# Patient Record
Sex: Female | Born: 1990 | Race: White | Hispanic: No | Marital: Married | Smoking: Never smoker
Health system: Southern US, Community
[De-identification: ages and names within clinical notes are randomized; demographics above are authoritative.]

## PROBLEM LIST (undated history)

## (undated) DIAGNOSIS — J309 Allergic rhinitis, unspecified: Secondary | ICD-10-CM

## (undated) DIAGNOSIS — IMO0002 Reserved for concepts with insufficient information to code with codable children: Secondary | ICD-10-CM

## (undated) DIAGNOSIS — N83209 Unspecified ovarian cyst, unspecified side: Secondary | ICD-10-CM

## (undated) DIAGNOSIS — R87629 Unspecified abnormal cytological findings in specimens from vagina: Secondary | ICD-10-CM

## (undated) DIAGNOSIS — G43019 Migraine without aura, intractable, without status migrainosus: Secondary | ICD-10-CM

## (undated) DIAGNOSIS — G43909 Migraine, unspecified, not intractable, without status migrainosus: Secondary | ICD-10-CM

## (undated) HISTORY — PX: FOOT SURGERY: SHX648

## (undated) HISTORY — DX: Allergic rhinitis, unspecified: J30.9

## (undated) HISTORY — DX: Migraine, unspecified, not intractable, without status migrainosus: G43.909

## (undated) HISTORY — DX: Unspecified ovarian cyst, unspecified side: N83.209

## (undated) HISTORY — DX: Unspecified abnormal cytological findings in specimens from vagina: R87.629

## (undated) HISTORY — PX: TONSILLECTOMY: SUR1361

## (undated) HISTORY — PX: TONSILLECTOMY: SHX5217

## (undated) HISTORY — DX: Reserved for concepts with insufficient information to code with codable children: IMO0002

## (undated) HISTORY — DX: Migraine without aura, intractable, without status migrainosus: G43.019

---
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
## 2010-08-10 LAB — HIV ANTIBODY (ROUTINE TESTING W REFLEX): HIV: NONREACTIVE

## 2010-09-20 ENCOUNTER — Ambulatory Visit: Payer: Self-pay | Admitting: Family Medicine

## 2010-11-22 ENCOUNTER — Ambulatory Visit: Payer: Self-pay | Admitting: Family Medicine

## 2011-01-02 ENCOUNTER — Ambulatory Visit
Admission: RE | Admit: 2011-01-02 | Discharge: 2011-01-02 | Payer: Self-pay | Source: Home / Self Care | Attending: Family Medicine | Admitting: Family Medicine

## 2011-04-23 ENCOUNTER — Ambulatory Visit (INDEPENDENT_AMBULATORY_CARE_PROVIDER_SITE_OTHER): Payer: 59

## 2011-06-25 ENCOUNTER — Encounter: Payer: Self-pay | Admitting: Medical

## 2011-06-25 ENCOUNTER — Ambulatory Visit (INDEPENDENT_AMBULATORY_CARE_PROVIDER_SITE_OTHER): Payer: 59 | Admitting: Medical

## 2011-06-25 VITALS — BP 114/80 | HR 77 | Temp 98.4°F | Ht 68.0 in | Wt 138.0 lb

## 2011-06-25 DIAGNOSIS — G43909 Migraine, unspecified, not intractable, without status migrainosus: Secondary | ICD-10-CM

## 2011-06-25 DIAGNOSIS — L03114 Cellulitis of left upper limb: Secondary | ICD-10-CM

## 2011-06-25 DIAGNOSIS — IMO0002 Reserved for concepts with insufficient information to code with codable children: Secondary | ICD-10-CM

## 2011-06-25 MED ORDER — DOXYCYCLINE HYCLATE 100 MG PO TABS: 100.0000 mg | ORAL_TABLET | Freq: Two times a day (BID) | ORAL | Status: AC

## 2011-06-25 MED ORDER — FROVATRIPTAN SUCCINATE 2.5 MG PO TABS: 2.5000 mg | ORAL_TABLET | ORAL | Status: DC | PRN

## 2011-06-25 MED ORDER — AMITRIPTYLINE HCL 10 MG PO TABS: 10.0000 mg | ORAL_TABLET | Freq: Every day | ORAL | Status: DC

## 2011-06-25 NOTE — Patient Instructions (Signed)
Cellulitis Cellulitis is an infection of the skin and the tissue beneath it. The area is typically red and tender. It is caused by germs (bacteria) (usually staph or strep) that enter the body through cuts or sores. Cellulitis most commonly occurs in the arms or lower legs.  HOME CARE INSTRUCTIONS  If you are given a prescription for medications which kill germs (antibiotics), take as directed until finished.   If the infection is on the arm or leg, keep the limb elevated as able.   Use a warm cloth several times per day to relieve pain and encourage healing.   See your caregiver for recheck of the infected site as needed if worsening, or sooner if problems arise.   Only take over-the-counter or prescription medicines for pain, discomfort, or fever as directed by your caregiver.  SEEK MEDICAL CARE IF:  An oral temperature above 101 develops, not controlled by medication.   The area of redness (inflammation) is spreading, there are red streaks coming from the infected site, or if a part of the infection begins to turn dark in color.   The joint or bone underneath the infected skin becomes painful after the skin has healed.   The infection returns in the same or another area after it seems to have gone away.   A boil or bump swells up. This may be an abscess.   New, unexplained problems such as pain or fever develop.  SEEK IMMEDIATE MEDICAL CARE IF:  You or your child feels drowsy or lethargic.   There is vomiting, diarrhea, or lasting discomfort or feeling ill (malaise) with muscle aches and pains.  MAKE SURE YOU:   Understand these instructions.   Will watch your condition.   Will get help right away if you are not doing well or get worse.  Document Released: 09/26/2005 Document Re-Released: 10/14/2009 [facility] Patient Information 2011 [place], [place].

## 2011-06-25 NOTE — Progress Notes (Signed)
  Subjective:   HPI  Marilyn Hunter is a 21 y.o. female who presents for c/o bug bite on left elbow area x 3 days.  She notes swelling, pain, warmth.  No prior similar reaction to bug bites.  She also has several mosquito bites on her legs, but no reaction like this.    She also reports h/xo migraines.   Has seen several neurologists in the past.  She has been prescribed preventative medications, but always declined to take them.  She notes 3 headaches at least per week.  Uses Excedrin migraine somewhat regularly, Frova in worse headaches.  She notes that certain foods and stress triggers the headaches. They are also worse around her periods.  She has been working on LSAT and applying to law school which has been stressful.  No other aggravating or relieving factors.  No other c/o.  The following portions of the patient's history were reviewed and updated as appropriate: allergies, current medications, past family history, past medical history, past social history, past surgical history and problem list.   Review of Systems Constitutional: denies fever, chills, sweats, unexpected weight change, anorexia, fatigue Allergy: congestion, sneezing ENT: no runny nose, ear pain, sore throat, hoarseness, sinus pain Cardiology: denies chest pain, palpitations, edema Respiratory: denies cough, shortness of breath, wheezing Gastroenterology: +nausea; denies abdominal pain, vomiting, diarrhea, constipation Musculoskeletal: denies arthralgias, myalgias, joint swelling, back pain Ophthalmology: denies vision changes Urology: denies dysuria, difficulty urinating, hematuria, urinary frequency, urgency Neurology: no headache, weakness, tingling, numbness Psychology: denies depressed mood, agitation, sleep problems     Objective:   Physical Exam  General appearance: alert, no distress, WD/WN, white female Skin: left forearm just distal to elbow with 5cm area of warmth, swelling, and faint erythema, tender.   No induration.  Otherwise skin unremarkable.  Neck: supple, no lymphadenopathy, no thyromegaly, no masses Heart: RRR, normal S1, S2, no murmurs Lungs: CTA bilaterally, no wheezes, rhonchi, or rales Extremities: no edema, no cyanosis, no clubbing Pulses: 2+ symmetric, upper and lower extremities, normal cap refill Neurological: alert, oriented x 3, CN2-12 intact, gait normal   Assessment :    Encounter Diagnoses  Name Primary?  . Cellulitis of arm, left Yes  . Migraine      Plan:    Cellulitis - Rx for Doxycycline, warm compresses, discussed signs of worsening infection.  Call/return if not improving in 2-3 days.  Migraines - she has long hx/o migraines, and has seen several neurologists prior. She had gotten scripts for preventative medications prior, but just never would take them for fear of side effects including weight gain.  She is getting 3 headaches weekly.  Uses Frova or Excedrin for acute headache.   She is agreeable to trial of Amitryptyline today.  Discussed risks/benefits, recheck in 26mo.

## 2011-08-21 ENCOUNTER — Ambulatory Visit (INDEPENDENT_AMBULATORY_CARE_PROVIDER_SITE_OTHER): Payer: 59 | Admitting: Family Medicine

## 2011-08-21 ENCOUNTER — Encounter: Payer: Self-pay | Admitting: Family Medicine

## 2011-08-21 VITALS — BP 120/72 | HR 88 | Wt 140.0 lb

## 2011-08-21 DIAGNOSIS — N390 Urinary tract infection, site not specified: Secondary | ICD-10-CM

## 2011-08-21 MED ORDER — SULFAMETHOXAZOLE-TRIMETHOPRIM 800-160 MG PO TABS: 1.0000 | ORAL_TABLET | Freq: Two times a day (BID) | ORAL | Status: AC

## 2011-08-21 NOTE — Progress Notes (Signed)
  Subjective:    Patient ID: Marilyn Hunter, female    DOB: 1990/08/13, 21 y.o.   MRN: [id number]  HPI Woke up this morning complaining of dysuria, frequency and low abdominal pressure   Review of Systems     Objective:   Physical Exam Alert and in no distress. Abdominal exam shows no masses or tenderness. Urine microscopic positive bacteria and white       Assessment & Plan:  UTI I will give her Septra. Also recommend Azo-Standard and call if not entirely better

## 2011-08-21 NOTE — Patient Instructions (Signed)
Drink plenty of fluids and use the Azo-Standard. If you're not act normal after 3 days, call me

## 2011-09-17 ENCOUNTER — Ambulatory Visit (INDEPENDENT_AMBULATORY_CARE_PROVIDER_SITE_OTHER): Payer: 59 | Admitting: Medical

## 2011-09-17 ENCOUNTER — Encounter: Payer: Self-pay | Admitting: Medical

## 2011-09-17 VITALS — BP 100/72 | HR 80 | Temp 98.1°F | Resp 18 | Ht 67.0 in | Wt 138.0 lb

## 2011-09-17 DIAGNOSIS — J329 Chronic sinusitis, unspecified: Secondary | ICD-10-CM

## 2011-09-17 DIAGNOSIS — G43909 Migraine, unspecified, not intractable, without status migrainosus: Secondary | ICD-10-CM

## 2011-09-17 MED ORDER — AMOXICILLIN 875 MG PO TABS: 875.0000 mg | ORAL_TABLET | Freq: Two times a day (BID) | ORAL | Status: AC

## 2011-09-17 NOTE — Progress Notes (Signed)
Subjective:     Marilyn Hunter is a 21 y.o. female who presents for evaluation of possible sinus infection.  She notes 3-4 days of sinus pressure, chest congestion, thick green/brown mucous in sputum coughed up from nasal drainage, sore throat, and feels exhausted.  Using OTC Mucinex and Afrin.  She has not yet started Amitriptyline.  She has been working with gynecology to help get periods under control which will hopefully ultimately help her headaches.  In the last year she has failed Yaz, Lo Estrin, and another OCP.  She is currently on her second Paediatric nurse.  No other c/o.  I saw her in 4/12 for sinus infection.   The following portions of the patient's history were reviewed and updated as appropriate: allergies, current medications, past family history, past medical history, past social history, past surgical history and problem list.  Past Medical History  Diagnosis Date  . Migraine     Review of Systems Constitutional: denies fever, chills, sweats, anorexia Skin: denies rash HEENT: +ear pressure; denies itchy watery eyes Cardiovascular: denies chest pain Lungs: denies wheezing Abdomen: denies abdominal pain, nausea, vomiting, diarrhea GU: denies dysuria  Objective:   Filed Vitals:   09/17/11 1003  BP: 100/72  Pulse: 80  Temp: 98.1 F (36.7 C)  Resp: 18    General appearance: Alert, WD/WN, no distress, fatigued appearing white female                             Skin: warm, no rash                           Head: + frontal sinus tenderness,                            Eyes: conjunctiva normal, corneas clear, PERRLA                            Ears: pearly TMs, external ear canals normal                          Nose: septum midline, turbinates swollen, with erythema and clear discharge             Mouth/throat: MMM, tongue normal, mild pharyngeal erythema                           Neck: supple, no adenopathy, no thyromegaly, non tender                          Heart: RRR,  normal S1, S2, no murmurs                         Lungs: CTA bilaterally, no wheezes, rales, or rhonchi      Assessment:   Encounter Diagnoses  Name Primary?  . Sinusitis Yes  . Migraine      Plan:   Prescription given for Amoxicillin.  Can c/t OTC Mucinex for congestion.  Tylenol or Ibuprofen OTC for fever and malaise.  Discussed symptomatic relief, nasal saline, and call or return if worse or not improving in 2-3 days.   Cautioned on Afrin, use 3 days or less only.   Migraines - she  has recently started Nuvaring to help get periods and hormonal changes under control. She is not taking Amitriptyline currently until gynecology helps get periods under control.

## 2011-11-06 ENCOUNTER — Ambulatory Visit: Payer: 59 | Admitting: Medical

## 2012-01-09 ENCOUNTER — Encounter: Payer: Self-pay | Admitting: Internal Medicine

## 2012-01-10 ENCOUNTER — Ambulatory Visit (INDEPENDENT_AMBULATORY_CARE_PROVIDER_SITE_OTHER): Payer: 59 | Admitting: Family Medicine

## 2012-01-10 ENCOUNTER — Encounter: Payer: Self-pay | Admitting: Family Medicine

## 2012-01-10 VITALS — BP 120/80 | HR 83 | Temp 98.5°F | Ht 67.0 in | Wt 135.0 lb

## 2012-01-10 DIAGNOSIS — J209 Acute bronchitis, unspecified: Secondary | ICD-10-CM

## 2012-01-10 DIAGNOSIS — J019 Acute sinusitis, unspecified: Secondary | ICD-10-CM

## 2012-01-10 MED ORDER — AMOXICILLIN 875 MG PO TABS: 875.0000 mg | ORAL_TABLET | Freq: Two times a day (BID) | ORAL | Status: AC

## 2012-01-10 NOTE — Progress Notes (Signed)
  Subjective:    Patient ID: Marilyn Hunter, female    DOB: 1990-08-27, 22 y.o.   MRN: [id number]  HPI Approximately one week ago she developed a sore throat and 2 days later noted the relatively quick onset of chest congestion productive cough, malaise, fatigue. Approximately 2 days ago then she noted the onset of nasal congestion, sinus pressure, headache, PND. She has not smoke or have underlying allergies   Review of Systems     Objective:   Physical Exam alert and in no distress. Tympanic membranes and canals are normal. Throat is clear. Tonsils are normal. Neck is supple without adenopathy or thyromegaly. Cardiac exam shows a regular sinus rhythm without murmurs or gallops. Lungs are clear to auscultation. As the mucosa is slightly red with tenderness over maxillary sinuses       Assessment & Plan:   1. Sinusitis acute   2. Bronchitis, acute    I will treat her with Amoxil. She will call if not entirely better.

## 2012-01-10 NOTE — Patient Instructions (Signed)
Take all of the antibiotic and if not totally back to normal, call for more.

## 2012-07-24 ENCOUNTER — Ambulatory Visit (INDEPENDENT_AMBULATORY_CARE_PROVIDER_SITE_OTHER): Payer: 59 | Admitting: Family Medicine

## 2012-07-24 ENCOUNTER — Encounter: Payer: Self-pay | Admitting: Family Medicine

## 2012-07-24 VITALS — BP 118/72 | HR 72 | Temp 97.8°F | Ht 67.0 in | Wt 140.0 lb

## 2012-07-24 DIAGNOSIS — G43109 Migraine with aura, not intractable, without status migrainosus: Secondary | ICD-10-CM

## 2012-07-24 DIAGNOSIS — J069 Acute upper respiratory infection, unspecified: Secondary | ICD-10-CM

## 2012-07-24 MED ORDER — AZITHROMYCIN 250 MG PO TABS: ORAL_TABLET | ORAL | Status: AC

## 2012-07-24 MED ORDER — FROVATRIPTAN SUCCINATE 2.5 MG PO TABS: 2.5000 mg | ORAL_TABLET | ORAL | Status: DC | PRN

## 2012-07-24 NOTE — Progress Notes (Signed)
Chief Complaint  Patient presents with  . Sore Throat    started Monday with sore throat, then by Tuesday she had green/brown drainage and cough. She feels like her neck is swollen. Would like to know if you would refill her Frova today- and she would also like to switch to you for her care, she will schedule CPE when she checks out.   HPI: Patient presents with sinus complaints.  Started out as sore throat 3 days ago, then progressed to sinus congestion, discolored mucus and cough. Phlegm is chunky brown/green, and is complaining of swelling and pain in her neck.  Having pain in both cheeks.  Denies sick contacts, fever.  Has been taking Dayquil/Nyquil, and Afrin x 2 days.  Last sinus infection was in January, treated with amoxil. Also had sinus infection in September, also treated with amoxil. She has some h/o allergies since living here.  Has never tried sinus rinses.  Migraines:  Gets migraines monthly with her periods.  Has been trying to work with her GYN to have cycles less frequently, but that doesn't seem to be working.  Gets 5 bad migraines per month, plus additional headaches.  Sometimes the headache is too far gone that she doesn't even bother to take the Frova.  She does get aura prior to headaches.  She previously was rx'd amitriptyline, but admits that she never started it, she was too afraid.  Past Medical History  Diagnosis Date  . Migraines     chronic  . Allergic rhinitis, cause unspecified    Past Surgical History  Procedure Date  . Tonsillectomy age 25  . Foot surgery L 01/2012; R 02/2012    bilateral taylor bunionectomies, and similar to regular bunionectomy surgeries (1st and 5th metatarsals bilaterally)   History   Social History  . Marital Status: Single    Spouse Name: N/A    Number of Children: N/A  . Years of Education: N/A   Occupational History  . paralegal    Social History Main Topics  . Smoking status: Never Smoker   . Smokeless tobacco: Never Used   . Alcohol Use: Yes     5 drinks in a month  . Drug Use: No  . Sexually Active: Yes -- Female partner(s)    Birth Control/ Protection: Other-see comments     NuvaRing   Other Topics Concern  . 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 Lives with her boyfriend.   Family History  Problem Relation Age of Onset  . Hodgkin's lymphoma Mother 96  . Thyroid cancer Mother     (from radiation/hodgkin's treatment)  . Breast cancer Mother 36    possibly related to radiation treatment for hodgkins  . Cancer Mother     hodgkin's; thyroid; breast; now new tumor in thoracic spine  . Diabetes Neg Hx   . Heart disease Neg Hx    Current Outpatient Prescriptions on File Prior to Visit  Medication Sig Dispense Refill  . aspirin-acetaminophen-caffeine (EXCEDRIN MIGRAINE) 250-250-65 MG per tablet Take 1 tablet by mouth every 6 (six) hours as needed.        . etonogestrel-ethinyl estradiol (NUVARING) 0.12-0.015 MG/24HR vaginal ring Place 1 each vaginally every 28 (twenty-eight) days. Insert vaginally and leave in place for 3 consecutive weeks, then remove for 1 week.       . frovatriptan (FROVA) 2.5 MG tablet Take 1 tablet (2.5 mg total) by mouth as  needed. If recurs, may repeat after 2 hours. Max of 3 tabs in 24 hours.  10 tablet  2   No Known Allergies  ROS: Denies fevers, nausea, vomiting, diarrhea, skin rash, shortness of breath, chest pain. Feels achey in general, and fatigued. Denies numbness, tingling, weakness  PHYSICAL EXAM: BP 118/72  Pulse 72  Temp 97.8 F (36.6 C) (Oral)  Ht 5\' 7"  (1.702 m)  Wt 140 lb (63.504 kg)  BMI 21.93 kg/m2  LMP 07/20/2012 Well developed, pleasant female in no distress, slightly congested sounding HEENT:  PERRL ,EOMI conjunctiva clear. TM's and EAC's normal.  OP normal, slight cobblestoning posteriorly.  Nasal mucosa moderately edematous, no erythema or purulence noted.  Tender over maxillary sinuses Neck: no  thyromegaly or mass.  Some shotty anterior cervical lymphadenopathy bilaterally, nontender Heart: regular rate and rhythm Lungs: clear bilaterally Skin: no rash Psych: normal mood ,affect, hygiene and grooming Neuro: alert and oriented, cranial nerves intact.  Normal strength, sensation, gait.  ASSESSMENT/PLAN:  1. URI (upper respiratory infection)  azithromycin (ZITHROMAX) 250 MG tablet  2. Migraine headache with aura  frovatriptan (FROVA) 2.5 MG tablet   URI vs early sinus infection Continue decongestants, add Guaifenesin (Mucinex) and sinus rinses.  If symptoms persist/worsen over the next 3-5 days, start antibiotics (z-pak)   Migraines:  Discussed reasons and types of preventative medications in detail.  I think that her frequency/severity of her migraines are likely affecting her quality of life, and think she would benefit from a preventative therapy.  Reminded to take meds early, at time of aura.  Consider other birth control choices, if estrogen may be contributing--discuss with her GYN (ie Depo Provera, vs condoms). Refilled Frova.  Discussed potential decrease in efficacy with overuse.  She will think about these options, and f/u at CPE, sooner prn.

## 2012-07-24 NOTE — Patient Instructions (Signed)
URI (viral) vs early sinus infection (bacterial) Continue decongestants, add Guaifenesin (Mucinex) and sinus rinses.  If symptoms persist/worsen over the next 3-5 days, start antibiotics (z-pak)   Migraines:  Discussed reasons and types of preventative medications in detail.  I think that your frequency/severity of migraines are likely affecting quality of life, and think you would benefit from a preventative therapy.  Remember to take meds early, at time of aura.  Consider other birth control choices, if estrogen may be contributing--discuss with her GYN (ie Depo Provera, vs condoms).

## 2012-10-02 ENCOUNTER — Ambulatory Visit (INDEPENDENT_AMBULATORY_CARE_PROVIDER_SITE_OTHER): Payer: 59 | Admitting: Family Medicine

## 2012-10-02 ENCOUNTER — Encounter: Payer: Self-pay | Admitting: Family Medicine

## 2012-10-02 VITALS — BP 108/60 | HR 72 | Ht 67.0 in | Wt 141.0 lb

## 2012-10-02 DIAGNOSIS — Z0000 Encounter for general adult medical examination without abnormal findings: Secondary | ICD-10-CM

## 2012-10-02 DIAGNOSIS — G43109 Migraine with aura, not intractable, without status migrainosus: Secondary | ICD-10-CM

## 2012-10-02 DIAGNOSIS — Z23 Encounter for immunization: Secondary | ICD-10-CM

## 2012-10-02 LAB — POCT URINALYSIS DIPSTICK
Bilirubin, UA: NEGATIVE
Ketones, UA: NEGATIVE
Leukocytes, UA: NEGATIVE
Protein, UA: NEGATIVE
Spec Grav, UA: 1.01

## 2012-10-02 MED ORDER — NORTRIPTYLINE HCL 10 MG PO CAPS: ORAL_CAPSULE | ORAL | Status: DC

## 2012-10-02 NOTE — Patient Instructions (Addendum)
HEALTH MAINTENANCE RECOMMENDATIONS:  It is recommended that you get at least 30 minutes of aerobic exercise at least 5 days/week (for weight loss, you may need as much as 60-90 minutes). This can be any activity that gets your heart rate up. This can be divided in 10-15 minute intervals if needed, but try and build up your endurance at least once a week.  Weight bearing exercise is also recommended twice weekly.  Eat a healthy diet with lots of vegetables, fruits and fiber.  "Colorful" foods have a lot of vitamins (ie green vegetables, tomatoes, red peppers, etc).  Limit sweet tea, regular sodas and alcoholic beverages, all of which has a lot of calories and sugar.  Up to 1 alcoholic drink daily may be beneficial for women (unless trying to lose weight, watch sugars).  Drink a lot of water.  Calcium recommendations are 1200-1500 mg daily (1500 mg for postmenopausal women or women without ovaries), and vitamin D 1000 IU daily.  This should be obtained from diet and/or supplements (vitamins), and calcium should not be taken all at once, but in divided doses.  Monthly self breast exams.  Sunscreen of at least SPF 30 should be used on all sun-exposed parts of the skin when outside between the hours of 10 am and 4 pm (not just when at beach or pool, but even with exercise, golf, tennis, and yard work!)  Use a sunscreen that says "broad spectrum" so it covers both UVA and UVB rays, and make sure to reapply every 1-2 hours.  Remember to change the batteries in your smoke detectors when changing your clock times in the spring and fall.  Use your seat belt every time you are in a car, and please drive safely and not be distracted with cell phones and texting while driving.  Start nortriptylene 10mg  at bedtime--start at 1 capsule, and increase after 1-2 weeks if not having side effects.  Hoping this will help with her sleep, as well as prevent migraines.  Keep headache journal (with keeping track of severity,  type of headache and frequency).  Please schedule routine eye and dental visits. Return in 6-8 weeks for follow up on headaches--come fasting (nothing to eat or drink other than water or BLACK coffee for 8 hours prior).

## 2012-10-02 NOTE — Progress Notes (Signed)
Chief Complaint  Patient presents with  . Annual Exam    non fasting annual exam, no pap-has one scheduled next week Oct 9th. Pt declined flu vaccine.   Marilyn Hunter is a 22 y.o. female who presents for a complete physical.  She has the following concerns:  Migraines: worse lately related to family stress.  Mother recently diagnosed with a tumor in her spine, felt to be cancerous.  Having a constant headache, and having tension/tightness at her temples.  Some blurred vision with nausea at least once a week, relieved by Frova.  Having trouble sleeping--falls asleep okay, but can't stay asleep.  Health Maintenance: There is no immunization history on file for this patient. She doesn't recall last tetanus--childhood (doesn't recall age) Declines flu shot Last Pap smear: 09/2011 Last mammogram: never Last colonoscopy: never Last DEXA: never Dentist: 1 year ago Ophtho: thinks glasses might help her when on computer--plans to make appointment, as this might contribute to eye strain and headaches Exercise--significantly decreased s/p bilateral foot surgeries, slowly getting back into her routine.  Exercising 2-3x/week.  Never had cholesterol screening.  +family h/o high cholesterol in grandfather [facility])  Past Medical History  Diagnosis Date  . Migraines     chronic  . Allergic rhinitis, cause unspecified     Past Surgical History  Procedure Date  . Tonsillectomy age 20  . Foot surgery L 01/2012; R 02/2012    bilateral taylor bunionectomies, and similar to regular bunionectomy surgeries (1st and 5th metatarsals bilaterally)    History   Social History  . Marital Status: Single    Spouse Name: N/A    Number of Children: N/A  . Years of Education: N/A   Occupational History  . paralegal    Social History Main Topics  . Smoking status: Never Smoker   . Smokeless tobacco: Never Used  . Alcohol Use: Yes     5 drinks in a month  . Drug Use: No  . Sexually Active: Yes -- Female  partner(s)    Birth Control/ Protection: Other-see comments     NuvaRing   Other Topics Concern  . 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 Lives with her boyfriend.    Family History  Problem Relation Age of Onset  . Hodgkin's lymphoma Mother 43  . Thyroid cancer Mother     (from radiation/hodgkin's treatment)  . Breast cancer Mother 54    possibly related to radiation treatment for hodgkins  . Cancer Mother     hodgkin's; thyroid; breast; now new tumor in thoracic spine  . Diabetes Neg Hx   . Heart disease Neg Hx   . Hyperlipidemia Maternal Grandfather     Current outpatient prescriptions:etonogestrel-ethinyl estradiol (NUVARING) 0.12-0.015 MG/24HR vaginal ring, Place 1 each vaginally every 28 (twenty-eight) days. Insert vaginally and leave in place for 3 consecutive weeks, then remove for 1 week. , Disp: , Rfl: ;  Multiple Vitamins-Minerals (MULTI-VITAMIN GUMMIES PO), Take 1 each by mouth daily., Disp: , Rfl:  aspirin-acetaminophen-caffeine (EXCEDRIN MIGRAINE) 250-250-65 MG per tablet, Take 1 tablet by mouth every 6 (six) hours as needed.  , Disp: , Rfl: ;  frovatriptan (FROVA) 2.5 MG tablet, Take 1 tablet (2.5 mg total) by mouth as needed. If recurs, may repeat after 2 hours. Max of 3 tabs in 24 hours., Disp: 10 tablet, Rfl: 2;  naproxen sodium (ANAPROX) 220 MG tablet, Take 220 mg by mouth 2 (two) times  daily with a meal., Disp: , Rfl:  nortriptyline (PAMELOR) 10 MG capsule, Take 1 capsule at bedtime.  Increase to 2 capsules after 1-2 weeks if still having poor sleep, Disp: 60 capsule, Rfl: 1  No Known Allergies  ROS: The patient denies anorexia, fever, weight changes,  decreased hearing, ear pain, sore throat, breast concerns, chest pain, palpitations, dizziness, syncope, dyspnea on exertion, cough, swelling, diarrhea, constipation, abdominal pain, melena, hematochezia, indigestion/heartburn, hematuria, incontinence, dysuria,  irregular menstrual cycles, vaginal discharge, odor or itch, genital lesions, joint pains, numbness, tingling, weakness, tremor, suspicious skin lesions, depression, anxiety, abnormal bleeding/bruising, or enlarged lymph nodes. +headaches--associated with blurred vision and nausea. +stress, terminal insomnia   PHYSICAL EXAM: BP 108/60  Pulse 72  Ht 5\' 7"  (1.702 m)  Wt 141 lb (63.957 kg)  BMI 22.08 kg/m2  LMP 09/09/2012  General Appearance:    Alert, cooperative, no distress, appears stated age  Head:    Normocephalic, without obvious abnormality, atraumatic  Eyes:    PERRL, conjunctiva/corneas clear, EOM's intact, fundi    benign  Ears:    Normal TM's and external ear canals  Nose:   Nares normal, mucosa normal, no drainage or sinus   tenderness  Throat:   Lips, mucosa, and tongue normal; teeth and gums normal  Neck:   Supple, no lymphadenopathy;  thyroid:  no   enlargement/tenderness/nodules; no carotid   bruit or JVD  Back:    Spine nontender, no curvature, ROM normal, no CVA     tenderness  Lungs:     Clear to auscultation bilaterally without wheezes, rales or     ronchi; respirations unlabored  Chest Wall:    No tenderness or deformity   Heart:    Regular rate and rhythm, S1 and S2 normal, no murmur, rub   or gallop  Breast Exam:    Deferred to GYN  Abdomen:     Soft, non-tender, nondistended, normoactive bowel sounds,    no masses, no hepatosplenomegaly  Genitalia:    Deferred to GYN     Extremities:   No clubbing, cyanosis or edema  Pulses:   2+ and symmetric all extremities  Skin:   Skin color, texture, turgor normal, no rashes or lesions  Lymph nodes:   Cervical, supraclavicular, and axillary nodes normal  Neurologic:   CNII-XII intact, normal strength, sensation and gait; reflexes 2+ and symmetric throughout          Psych:   Normal mood, affect, hygiene and grooming.     ASSESSMENT/PLAN: 1. Routine general medical examination at a health care facility  Visual acuity  screening, POCT Urinalysis Dipstick  2. Migraine headache with aura  nortriptyline (PAMELOR) 10 MG capsule  3. Need for Tdap vaccination  Tdap vaccine greater than or equal to 7yo IM    Migraines--discussed stress reduction techniques, exercise, need for adequate sleep.  May have cyclical component.  At this point, she would like to try a preventative medication, rather than changing to Depo Provera thru her GYN.  Discussed nortriptylene 10mg  at bedtime--start at 1 capsule, and increase after 1-2 weeks if not having side effects.  Hoping this will help with her sleep, as well as prevent migraines.  Keep headache journal (with keeping track of severity, type of headache and frequency).  Discussed monthly self breast exams, at least 30 minutes of aerobic activity at least 5 days/week; proper sunscreen use reviewed; healthy diet, including goals of calcium and vitamin D intake and alcohol recommendations (less than or equal to  1 drink/day) reviewed; regular seatbelt use; changing batteries in smoke detectors.  Immunization recommendations discussed.  TdaP today Refuses flu shot She reports having had the HPV series  Schedule dental and eye appointments. Lipid panel is recommended--not fasting today.  Prefers to return fasting for f/u.  F/u 6-8 weeks (fasting) for labs and f/u migraines

## 2012-10-08 LAB — HM PAP SMEAR

## 2012-11-13 ENCOUNTER — Ambulatory Visit: Payer: 59 | Admitting: Family Medicine

## 2013-03-27 ENCOUNTER — Encounter: Payer: Self-pay | Admitting: Medical

## 2013-03-27 ENCOUNTER — Telehealth: Payer: Self-pay | Admitting: Family Medicine

## 2013-03-27 ENCOUNTER — Ambulatory Visit (INDEPENDENT_AMBULATORY_CARE_PROVIDER_SITE_OTHER): Payer: Managed Care, Other (non HMO) | Admitting: Medical

## 2013-03-27 VITALS — BP 118/78 | HR 100 | Temp 98.5°F | Resp 16 | Wt 140.0 lb

## 2013-03-27 DIAGNOSIS — G43901 Migraine, unspecified, not intractable, with status migrainosus: Secondary | ICD-10-CM

## 2013-03-27 MED ORDER — PROMETHAZINE HCL 25 MG/ML IJ SOLN
25.0000 mg | Freq: Once | INTRAMUSCULAR | Status: AC
  Administered 2013-03-27: 25 mg via INTRAMUSCULAR

## 2013-03-27 MED ORDER — KETOROLAC TROMETHAMINE 60 MG/2ML IM SOLN
60.0000 mg | Freq: Once | INTRAMUSCULAR | Status: AC
  Administered 2013-03-27: 60 mg via INTRAMUSCULAR

## 2013-03-27 MED ORDER — METHYLPREDNISOLONE 4 MG PO KIT: PACK | ORAL | Status: DC

## 2013-03-27 NOTE — Progress Notes (Signed)
  Subjective:    Marilyn Hunter is a 23 y.o. female who presents for bad migraine.  She has hx/o migraine with aura.  Saw me for similar a year or more ago but never started Amitriptyline as a preventative measure.  Lately has gotten 2 or more headaches per week, and usually Frova or Excedrin helps, but current migraine began about 5 days ago with aura, and has not let up. Generally, the headaches last about all day since Monday. The headaches are usually moderate and throbbing and are located in right frontal/temporal region.  She does note photophobia, phonophobia, tension in neck and back, some nausea, lack of appetite.  No numbness, tingling, weakness, vision loss or nose bleed.  No recent allergy symptoms, no sinus pressure, not sure of the recent triggers.  Right eye has seemed blurry this week.  Has used Frova and Excedrin this week with no improvement.   She wants a referral to neurology.  No other aggravating or relieving factors.  The following portions of the patient's history were reviewed and updated as appropriate: allergies, current medications, past family history, past medical history, past social history, past surgical history and problem list.  ROS as in subjective    Objective:   Physical Exam  Filed Vitals:   03/27/13 1335  BP: 118/78  Pulse: 100  Temp: 98.5 F (36.9 C)  Resp: 16    General appearance: alert, WD/WN, afebrile, seated in darkened exam room, fatigued appearing HEENT: normocephalic, sclerae anicteric, PERRLA, EOMi, nares patent, no discharge or erythema, pharynx normal Oral cavity: MMM, no lesions Neck: supple, no lymphadenopathy, no thyromegaly, no masses, no bruits Heart: RRR, normal S1, S2, no murmurs Lungs: CTA bilaterally, no wheezes, rhonchi, or rales Extremities: no edema, no cyanosis, no clubbing Pulses: 2+ symmetric, upper and lower extremities, normal cap refill Neurological: alert, oriented x 3, CN2-12 intact, strength normal upper extremities  and lower extremities, sensation normal throughout, DTRs 2+ throughout, no cerebellar signs, gait normal Psychiatric: normal affect, behavior normal, pleasant     Assessment:   Encounter Diagnosis  Name Primary?  . Migraine with status migrainosus Yes     Plan:   Discussed symptoms, abortive measures, options for therapy.  Gave 60mg  Toradol and 25mg  Phenergan IM in office.  Boyfriend will take her home to rest.  She will lie in quiet dark room.  If not significantly improved by tomorrow, she will begin Medrol dose pack.  Referral to neurology, call or return if worse or not improving.

## 2013-03-27 NOTE — Telephone Encounter (Signed)
I fax everything over to [facility] for referral for Migraines. CLS They will contact the patient about the appointment. CLS [phone]

## 2013-03-27 NOTE — Telephone Encounter (Signed)
Message copied by Janeice Robinson on Fri Mar 27, 2013  4:57 PM ------      Message from: Jac Canavan      Created: Fri Mar 27, 2013  1:54 PM       Refer to [facility] for consult on migraines.  Let them know that her friend Gearldine Shown (friend of her boyfriend's father) recommended she come see them.   ------

## 2013-03-28 ENCOUNTER — Encounter: Payer: Self-pay | Admitting: Medical

## 2013-08-13 ENCOUNTER — Other Ambulatory Visit: Payer: Self-pay | Admitting: Family Medicine

## 2013-10-07 ENCOUNTER — Encounter: Payer: Self-pay | Admitting: Nurse Practitioner

## 2013-10-12 ENCOUNTER — Ambulatory Visit: Payer: Self-pay | Admitting: Nurse Practitioner

## 2013-10-23 ENCOUNTER — Ambulatory Visit: Payer: Managed Care, Other (non HMO) | Admitting: Nurse Practitioner

## 2013-11-11 ENCOUNTER — Encounter: Payer: Self-pay | Admitting: Nurse Practitioner

## 2013-11-11 ENCOUNTER — Ambulatory Visit (INDEPENDENT_AMBULATORY_CARE_PROVIDER_SITE_OTHER): Payer: BC Managed Care – PPO | Admitting: Nurse Practitioner

## 2013-11-11 VITALS — BP 110/70 | HR 76 | Resp 16 | Ht 67.0 in | Wt 145.0 lb

## 2013-11-11 DIAGNOSIS — Z01419 Encounter for gynecological examination (general) (routine) without abnormal findings: Secondary | ICD-10-CM

## 2013-11-11 DIAGNOSIS — G43709 Chronic migraine without aura, not intractable, without status migrainosus: Secondary | ICD-10-CM

## 2013-11-11 MED ORDER — ETONOGESTREL-ETHINYL ESTRADIOL 0.12-0.015 MG/24HR VA RING: VAGINAL_RING | VAGINAL | Status: DC

## 2013-11-11 MED ORDER — METRONIDAZOLE 0.75 % VA GEL: 1.0000 | Freq: Every day | VAGINAL | Status: DC

## 2013-11-11 NOTE — Progress Notes (Signed)
Patient ID: Marilyn Hunter, female   DOB: 1990/06/29, 23 y.o.   MRN: [id number] 23 y.o. G0P0 Single Caucasian Fe here for annual exam.  She is on continuous active Nuva ring secondary to migraine headaches.  She had menses in October secondary to being out of RX. Lasted for 5 days and heavy, headaches for 4 days.  Now engaged and will be married on  May 22, 2014.  Same partner X 5 years.  After last menses noted a vaginal odor that has been on and off.  Patient's last menstrual period was 10/25/2013.          Sexually active: yes  The current method of family planning is NuvaRing vaginal inserts.    Exercising: yes  Gym/ health club routine includes 2-3 times per week. Smoker:  no  Health Maintenance: Pap: 10/08/12, WNL X 4 years TDaP: 09/2012 Gardasil: completed in 2007 Labs: declined   reports that she has never smoked. She has never used smokeless tobacco. She reports that she drinks about 0.5 ounces of alcohol per week. She reports that she does not use illicit drugs.  Past Medical History  Diagnosis Date  . Migraines     chronic  . Allergic rhinitis, cause unspecified   . Abnormal Pap smear 2006    ASCUS, pos HR HPV    Past Surgical History  Procedure Laterality Date  . Tonsillectomy  age 15  . Foot surgery  L 01/2012; R 02/2012    bilateral taylor bunionectomies, and similar to regular bunionectomy surgeries (1st and 5th metatarsals bilaterally)    Current Outpatient Prescriptions  Medication Sig Dispense Refill  . aspirin-acetaminophen-caffeine (EXCEDRIN MIGRAINE) 250-250-65 MG per tablet Take 1 tablet by mouth every 6 (six) hours as needed.        . etonogestrel-ethinyl estradiol (NUVARING) 0.12-0.015 MG/24HR vaginal ring Use as directed for 3 weeks and then replace for continuous active ring  4 each  3  . FROVA 2.5 MG tablet TAKE 1 TABLET AS NEEDED, IF RECURS MAY REPEAT AFTER 2 HOURS (MAX 3 TABS IN 24 HOURS)  10 tablet  0  . Multiple Vitamins-Minerals (MULTI-VITAMIN GUMMIES  PO) Take 1 each by mouth daily.      . metroNIDAZOLE (METROGEL) 0.75 % vaginal gel Place 1 Applicatorful vaginally at bedtime.  70 g  0   No current facility-administered medications for this visit.    Family History  Problem Relation Age of Onset  . Hodgkin's lymphoma Mother 45  . Thyroid cancer Mother     (from radiation/hodgkin's treatment)  . Breast cancer Mother 5    possibly related to radiation treatment for hodgkins  . Cancer Mother     hodgkin's; thyroid; breast; now new tumor in thoracic spine  . Diabetes Neg Hx   . Heart disease Neg Hx   . Hyperlipidemia Maternal Grandfather     ROS:  Pertinent items are noted in HPI.  Otherwise, a comprehensive ROS was negative.  Exam:   BP 110/70  Pulse 76  Resp 16  Ht 5\' 7"  (1.702 m)  Wt 145 lb (65.772 kg)  BMI 22.71 kg/m2  LMP 10/25/2013 Height: 5\' 7"  (170.2 cm)  Ht Readings from Last 3 Encounters:  11/11/13 5\' 7"  (1.702 m)  10/02/12 5\' 7"  (1.702 m)  07/24/12 5\' 7"  (1.702 m)    General appearance: alert, cooperative and appears stated age Head: Normocephalic, without obvious abnormality, atraumatic Neck: no adenopathy, supple, symmetrical, trachea midline and thyroid normal to inspection and palpation Lungs: clear  to auscultation bilaterally Breasts: normal appearance, no masses or tenderness Heart: regular rate and rhythm Abdomen: soft, non-tender; no masses,  no organomegaly Extremities: extremities normal, atraumatic, no cyanosis or edema Skin: Skin color, texture, turgor normal. No rashes or lesions Lymph nodes: Cervical, supraclavicular, and axillary nodes normal. No abnormal inguinal nodes palpated Neurologic: Grossly normal   Pelvic: External genitalia:  no lesions              Urethra:  normal appearing urethra with no masses, tenderness or lesions              Bartholin's and Skene's: normal                 Vagina: normal appearing vagina with normal color and discharge, no lesions              Cervix:  anteverted              Pap taken: no Bimanual Exam:  Uterus:  normal size, contour, position, consistency, mobility, non-tender              Adnexa: no mass, fullness, tenderness               Rectovaginal: Confirms               Anus:  normal sphincter tone, no lesions  A:  Well Woman with normal exam  Contraception  History of migraine headaches without Aura  Vaginal odor on /off  P:   Pap smear as per guidelines   Refill on Nuva Rinf  counseled on breast self exam, adequate intake of calcium and vitamin D, diet and exercise return annually or prn  An After Visit Summary was printed and given to the patient.

## 2013-11-11 NOTE — Patient Instructions (Signed)
General topics  Next pap or exam is  due in 1 year Take a Women's multivitamin Take 1200 mg. of calcium daily - prefer dietary If any concerns in interim to call back  Breast Self-Awareness Practicing breast self-awareness may pick up problems early, prevent significant medical complications, and possibly save your life. By practicing breast self-awareness, you can become familiar with how your breasts look and feel and if your breasts are changing. This allows you to notice changes early. It can also offer you some reassurance that your breast health is good. One way to learn what is normal for your breasts and whether your breasts are changing is to do a breast self-exam. If you find a lump or something that was not present in the past, it is best to contact your caregiver right away. Other findings that should be evaluated by your caregiver include nipple discharge, especially if it is bloody; skin changes or reddening; areas where the skin seems to be pulled in (retracted); or new lumps and bumps. Breast pain is seldom associated with cancer (malignancy), but should also be evaluated by a caregiver. BREAST SELF-EXAM The best time to examine your breasts is 5 7 days after your menstrual period is over.  ExitCare Patient Information 2013 [employer].   Exercise to Stay Healthy Exercise helps you become and stay healthy. EXERCISE IDEAS AND TIPS Choose exercises that:  You enjoy.  Fit into your day. You do not need to exercise really hard to be healthy. You can do exercises at a slow or medium level and stay healthy. You can:  Stretch before and after working out.  Try yoga, Pilates, or tai chi.  Lift weights.  Walk fast, swim, jog, run, climb stairs, bicycle, dance, or rollerskate.  Take aerobic classes. Exercises that burn about 150 calories:  Running 1  miles in 15 minutes.  Playing volleyball for 45 to 60 minutes.  Washing and waxing a car for 45 to 60  minutes.  Playing touch football for 45 minutes.  Walking 1  miles in 35 minutes.  Pushing a stroller 1  miles in 30 minutes.  Playing basketball for 30 minutes.  Raking leaves for 30 minutes.  Bicycling 5 miles in 30 minutes.  Walking 2 miles in 30 minutes.  Dancing for 30 minutes.  Shoveling snow for 15 minutes.  Swimming laps for 20 minutes.  Walking up stairs for 15 minutes.  Bicycling 4 miles in 15 minutes.  Gardening for 30 to 45 minutes.  Jumping rope for 15 minutes.  Washing windows or floors for 45 to 60 minutes. Document Released: 01/19/2011 Document Revised: 03/10/2012 Document Reviewed: 01/19/2011 ExitCare Patient Information 2013 [employer].   Other topics ( that may be useful information):    Sexually Transmitted Disease Sexually transmitted disease (STD) refers to any infection that is passed from person to person during sexual activity. This may happen by way of saliva, semen, blood, vaginal mucus, or urine. Common STDs include:  Gonorrhea.  Chlamydia.  Syphilis.  HIV/AIDS.  Genital herpes.  Hepatitis B and C.  Trichomonas.  Human papillomavirus (HPV).  Pubic lice. CAUSES  An STD may be spread by bacteria, virus, or parasite. A person can get an STD by:  Sexual intercourse with an infected person.  Sharing sex toys with an infected person.  Sharing needles with an infected person.  Having intimate contact with the genitals, mouth, or rectal areas of an infected person. SYMPTOMS  Some people may not have any symptoms, but   they can still pass the infection to others. Different STDs have different symptoms. Symptoms include:  Painful or bloody urination.  Pain in the pelvis, abdomen, vagina, anus, throat, or eyes.  Skin rash, itching, irritation, growths, or sores (lesions). These usually occur in the genital or anal area.  Abnormal vaginal discharge.  Penile discharge in men.  Soft, flesh-colored skin growths in the  genital or anal area.  Fever.  Pain or bleeding during sexual intercourse.  Swollen glands in the groin area.  Yellow skin and eyes (jaundice). This is seen with hepatitis. DIAGNOSIS  To make a diagnosis, your caregiver may:  Take a medical history.  Perform a physical exam.  Take a specimen (culture) to be examined.  Examine a sample of discharge under a microscope.  Perform blood test TREATMENT   Chlamydia, gonorrhea, trichomonas, and syphilis can be cured with antibiotic medicine.  Genital herpes, hepatitis, and HIV can be treated, but not cured, with prescribed medicines. The medicines will lessen the symptoms.  Genital warts from HPV can be treated with medicine or by freezing, burning (electrocautery), or surgery. Warts may come back.  HPV is a virus and cannot be cured with medicine or surgery.However, abnormal areas may be followed very closely by your caregiver and may be removed from the cervix, vagina, or vulva through office procedures or surgery. If your diagnosis is confirmed, your recent sexual partners need treatment. This is true even if they are symptom-free or have a negative culture or evaluation. They should not have sex until their caregiver says it is okay. HOME CARE INSTRUCTIONS  All sexual partners should be informed, tested, and treated for all STDs.  Take your antibiotics as directed. Finish them even if you start to feel better.  Only take over-the-counter or prescription medicines for pain, discomfort, or fever as directed by your caregiver.  Rest.  Eat a balanced diet and drink enough fluids to keep your urine clear or pale yellow.  Do not have sex until treatment is completed and you have followed up with your caregiver. STDs should be checked after treatment.  Keep all follow-up appointments, Pap tests, and blood tests as directed by your caregiver.  Only use latex condoms and water-soluble lubricants during sexual activity. Do not use  petroleum jelly or oils.  Avoid alcohol and illegal drugs.  Get vaccinated for HPV and hepatitis. If you have not received these vaccines in the past, talk to your caregiver about whether one or both might be right for you.  Avoid risky sex practices that can break the skin. The only way to avoid getting an STD is to avoid all sexual activity.Latex condoms and dental dams (for oral sex) will help lessen the risk of getting an STD, but will not completely eliminate the risk. SEEK MEDICAL CARE IF:   You have a fever.  You have any new or worsening symptoms. Document Released: 03/09/2003 Document Revised: 03/10/2012 Document Reviewed: 03/16/2011 ExitCare Patient Information 2013 [employer].    Domestic Abuse You are being battered or abused if someone close to you hits, pushes, or physically hurts you in any way. You also are being abused if you are forced into activities. You are being sexually abused if you are forced to have sexual contact of any kind. You are being emotionally abused if you are made to feel worthless or if you are constantly threatened. It is important to remember that help is available. No one has the right to abuse you. PREVENTION OF FURTHER   ABUSE  Learn the warning signs of danger. This varies with situations but may include: the use of alcohol, threats, isolation from friends and family, or forced sexual contact. Leave if you feel that violence is going to occur.  If you are attacked or beaten, report it to the police so the abuse is documented. You do not have to press charges. The police can protect you while you or the attackers are leaving. Get the officer's name and badge number and a copy of the report.  Find someone you can trust and tell them what is happening to you: your caregiver, a nurse, clergy member, close friend or family member. Feeling ashamed is natural, but remember that you have done nothing wrong. No one deserves abuse. Document Released:  12/14/2000 Document Revised: 03/10/2012 Document Reviewed: 02/22/2011 ExitCare Patient Information 2013 [employer].    How Much is Too Much Alcohol? Drinking too much alcohol can cause injury, accidents, and health problems. These types of problems can include:   Car crashes.  Falls.  Family fighting (domestic violence).  Drowning.  Fights.  Injuries.  Burns.  Damage to certain organs.  Having a baby with birth defects. ONE DRINK CAN BE TOO MUCH WHEN YOU ARE:  Working.  Pregnant or breastfeeding.  Taking medicines. Ask your doctor.  Driving or planning to drive. If you or someone you know has a drinking problem, get help from a doctor.  Document Released: 10/13/2009 Document Revised: 03/10/2012 Document Reviewed: 10/13/2009 ExitCare Patient Information 2013 [employer].   Smoking Hazards Smoking cigarettes is extremely bad for your health. Tobacco smoke has over 200 known poisons in it. There are over 60 chemicals in tobacco smoke that cause cancer. Some of the chemicals found in cigarette smoke include:   Cyanide.  Benzene.  Formaldehyde.  Methanol (wood alcohol).  Acetylene (fuel used in welding torches).  Ammonia. Cigarette smoke also contains the poisonous gases nitrogen oxide and carbon monoxide.  Cigarette smokers have an increased risk of many serious medical problems and Smoking causes approximately:  90% of all lung cancer deaths in men.  80% of all lung cancer deaths in women.  90% of deaths from chronic obstructive lung disease. Compared with nonsmokers, smoking increases the risk of:  Coronary heart disease by 2 to 4 times.  Stroke by 2 to 4 times.  Men developing lung cancer by 23 times.  Women developing lung cancer by 13 times.  Dying from chronic obstructive lung diseases by 12 times.  . Smoking is the most preventable cause of death and disease in our society.  WHY IS SMOKING ADDICTIVE?  Nicotine is the chemical  agent in tobacco that is capable of causing addiction or dependence.  When you smoke and inhale, nicotine is absorbed rapidly into the bloodstream through your lungs. Nicotine absorbed through the lungs is capable of creating a powerful addiction. Both inhaled and non-inhaled nicotine may be addictive.  Addiction studies of cigarettes and spit tobacco show that addiction to nicotine occurs mainly during the teen years, when young people begin using tobacco products. WHAT ARE THE BENEFITS OF QUITTING?  There are many health benefits to quitting smoking.   Likelihood of developing cancer and heart disease decreases. Health improvements are seen almost immediately.  Blood pressure, pulse rate, and breathing patterns start returning to normal soon after quitting. QUITTING SMOKING   American Lung Association - 1-800-LUNGUSA  American Cancer Society - 1-800-ACS-2345 Document Released: 01/24/2005 Document Revised: 03/10/2012 Document Reviewed: 09/28/2009 ExitCare Patient Information 2013 ExitCare[employer]   LL[employer].   Stress Management Stress is a state of physical or mental tension that often results from changes in your life or normal routine. Some common causes of stress are:  Death of a loved one.  Injuries or severe illnesses.  Getting fired or changing jobs.  Moving into a new home. Other causes may be:  Sexual problems.  Business or financial losses.  Taking on a large debt.  Regular conflict with someone at home or at work.  Constant tiredness from lack of sleep. It is not just bad things that are stressful. It may be stressful to:  Win the lottery.  Get married.  Buy a new car. The amount of stress that can be easily tolerated varies from person to person. Changes generally cause stress, regardless of the types of change. Too much stress can affect your health. It may lead to physical or emotional problems. Too little stress (boredom) may also become stressful. SUGGESTIONS TO  REDUCE STRESS:  Talk things over with your family and friends. It often is helpful to share your concerns and worries. If you feel your problem is serious, you may want to get help from a professional counselor.  Consider your problems one at a time instead of lumping them all together. Trying to take care of everything at once may seem impossible. List all the things you need to do and then start with the most important one. Set a goal to accomplish 2 or 3 things each day. If you expect to do too many in a single day you will naturally fail, causing you to feel even more stressed.  Do not use alcohol or drugs to relieve stress. Although you may feel better for a short time, they do not remove the problems that caused the stress. They can also be habit forming.  Exercise regularly - at least 3 times per week. Physical exercise can help to relieve that "uptight" feeling and will relax you.  The shortest distance between despair and hope is often a good night's sleep.  Go to bed and get up on time allowing yourself time for appointments without being rushed.  Take a short "time-out" period from any stressful situation that occurs during the day. Close your eyes and take some deep breaths. Starting with the muscles in your face, tense them, hold it for a few seconds, then relax. Repeat this with the muscles in your neck, shoulders, hand, stomach, back and legs.  Take good care of yourself. Eat a balanced diet and get plenty of rest.  Schedule time for having fun. Take a break from your daily routine to relax. HOME CARE INSTRUCTIONS   Call if you feel overwhelmed by your problems and feel you can no longer manage them on your own.  Return immediately if you feel like hurting yourself or someone else. Document Released: 06/12/2001 Document Revised: 03/10/2012 Document Reviewed: 02/02/2008 ExitCare Patient Information 2013 [employer].   

## 2013-11-15 NOTE — Progress Notes (Signed)
Encounter reviewed by Dr. Brook Silva.  

## 2014-10-01 ENCOUNTER — Ambulatory Visit: Payer: BC Managed Care – PPO | Admitting: Medical

## 2014-10-04 ENCOUNTER — Ambulatory Visit: Payer: BC Managed Care – PPO | Admitting: Medical

## 2014-10-05 ENCOUNTER — Ambulatory Visit (INDEPENDENT_AMBULATORY_CARE_PROVIDER_SITE_OTHER): Payer: BC Managed Care – PPO | Admitting: Medical

## 2014-10-05 ENCOUNTER — Encounter: Payer: Self-pay | Admitting: Medical

## 2014-10-05 VITALS — BP 100/80 | HR 82 | Temp 97.7°F | Resp 16 | Wt 154.0 lb

## 2014-10-05 DIAGNOSIS — J0110 Acute frontal sinusitis, unspecified: Secondary | ICD-10-CM

## 2014-10-05 MED ORDER — AMOXICILLIN 875 MG PO TABS: 875.0000 mg | ORAL_TABLET | Freq: Two times a day (BID) | ORAL | Status: DC

## 2014-10-05 NOTE — Progress Notes (Signed)
Subjective:  Marilyn Hunter is a 24 y.o. female who presents for illness.  Symptoms include 5 day hx/o sinus pressure, pain, fever to 100.4, coughing up brown and green mucous, likewise has green brown nasal discharge, pressure in ears.  Denies fever, nausea, vomiting, diarrhea, SOB.  Past history is significant for sinus infection with similar sympotms in the past.  Feels like this is more than a cold given prior hx/o and prior sinus infections similar.  Patient is a non-smoker.  Using Mucinex, EmergenC for symptoms.  + sick contacts.  No other aggravating or relieving factors.  No other c/o.  ROS as in subjective   Objective: Filed Vitals:   10/05/14 1023  BP: 100/80  Pulse: 82  Temp: 97.7 F (36.5 C)  Resp: 16    General appearance: Alert, WD/WN, no distress                             Skin: warm, no rash                           Head: +frontal sinus tenderness,                            Eyes: conjunctiva normal, corneas clear, PERRLA                            Ears: pearly TMs, external ear canals normal                          Nose: septum midline, turbinates swollen, with erythema and purulent discharge             Mouth/throat: MMM, tongue normal, mild pharyngeal erythema                           Neck: supple, no adenopathy, no thyromegaly, nontender                          Heart: RRR, normal S1, S2, no murmurs                         Lungs: CTA bilaterally, no wheezes, rales, or rhonchi      Assessment and Plan:   Encounter Diagnosis  Name Primary?  . Acute frontal sinusitis, recurrence not specified Yes    Prescription given for Amoxicillin.  Can use OTC Mucinex for congestion.  Tylenol or Ibuprofen OTC for fever and malaise.  Discussed symptomatic relief, nasal saline flush, and call or return if worse or not improving in 2-3 days.

## 2014-11-15 ENCOUNTER — Telehealth: Payer: Self-pay | Admitting: Nurse Practitioner

## 2014-11-15 ENCOUNTER — Ambulatory Visit: Payer: BC Managed Care – PPO | Admitting: Nurse Practitioner

## 2014-11-15 NOTE — Telephone Encounter (Signed)
Patient called and cancelled her AEX with [facility]andy. 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 I did not apply a fee as she said she was just notified of the test yesterday on Sunday. FYI only. Patient rescheduled for 01/11/15.

## 2015-01-07 ENCOUNTER — Telehealth: Payer: Self-pay | Admitting: Nurse Practitioner

## 2015-01-07 NOTE — Telephone Encounter (Signed)
Left message for patient to call when she is ready to rs.

## 2015-01-11 ENCOUNTER — Ambulatory Visit: Payer: BC Managed Care – PPO | Admitting: Nurse Practitioner

## 2015-03-07 ENCOUNTER — Encounter: Payer: Self-pay | Admitting: Family Medicine

## 2015-03-07 ENCOUNTER — Ambulatory Visit (INDEPENDENT_AMBULATORY_CARE_PROVIDER_SITE_OTHER): Payer: BLUE CROSS/BLUE SHIELD | Admitting: Family Medicine

## 2015-03-07 VITALS — BP 120/80 | HR 115 | Temp 98.6°F | Wt 155.0 lb

## 2015-03-07 DIAGNOSIS — J029 Acute pharyngitis, unspecified: Secondary | ICD-10-CM

## 2015-03-07 DIAGNOSIS — N921 Excessive and frequent menstruation with irregular cycle: Secondary | ICD-10-CM | POA: Diagnosis not present

## 2015-03-07 DIAGNOSIS — G43909 Migraine, unspecified, not intractable, without status migrainosus: Secondary | ICD-10-CM | POA: Insufficient documentation

## 2015-03-07 DIAGNOSIS — G43829 Menstrual migraine, not intractable, without status migrainosus: Secondary | ICD-10-CM | POA: Diagnosis not present

## 2015-03-07 DIAGNOSIS — N943 Premenstrual tension syndrome: Secondary | ICD-10-CM | POA: Diagnosis not present

## 2015-03-07 LAB — POCT RAPID STREP A (OFFICE): RAPID STREP A SCREEN: NEGATIVE

## 2015-03-07 MED ORDER — FROVATRIPTAN SUCCINATE 2.5 MG PO TABS: ORAL_TABLET | ORAL | Status: DC

## 2015-03-07 NOTE — Patient Instructions (Signed)
Take 4 Advil 3 times a day for the aches and pains and also the bleeding. I called in the Frova. Follow-up with your GYN concerning the bleeding and possibly getting 1 some form of birth control

## 2015-03-07 NOTE — Progress Notes (Signed)
   Subjective:    Patient ID: Marilyn Hunter, female    DOB: September 19, 1990, 25 y.o.   MRN: [id number]  HPI  she has a one-day history of sore throat, nasal congestion, chills , dry cough with fatigue and myalgias. No earache or fever. Her last normal menstrual cycle was in mid January. Since then she has had a menstrual flow every 2 weeks and initially it is quite heavy and his toes taper off. She does have cramping with this. She has a history of menstrual migraines and therefore has not been on birth control pills. Apparently Provera has not worked well for her either. Frova does help with her migraines.   Review of Systems     Objective:   Physical Exam Alert and in no distress. Tympanic membranes and canals are normal. Pharyngeal area is normal. Neck is supple without adenopathy or thyromegaly. Cardiac exam shows a regular sinus rhythm without murmurs or gallops. Lungs are clear to auscultation.  strep screen is negative.       Assessment & Plan:  Menstrual migraine without status migrainosus, not intractable - Plan: frovatriptan (FROVA) 2.5 MG tablet  Acute pharyngitis, unspecified pharyngitis type - Plan: Rapid Strep A  Menorrhagia with irregular cycle  supportive care for the sore throat. Recommend 800 mg of  Advil 3 times per day to help with the bleeding and cramping. Recommend she follow-up with her gynecologist especially in regard to her menorrhagia and use of hormonal manipulation in lieu of her menstrual migraines.

## 2015-03-08 ENCOUNTER — Telehealth: Payer: Self-pay | Admitting: Family Medicine

## 2015-03-08 MED ORDER — BENZONATATE 100 MG PO CAPS: 200.0000 mg | ORAL_CAPSULE | Freq: Three times a day (TID) | ORAL | Status: DC | PRN

## 2015-03-08 NOTE — Telephone Encounter (Signed)
Pt.notified

## 2015-03-08 NOTE — Telephone Encounter (Signed)
Let her know that I called in a cough suppressant but also have her use Emetrol for the stomach.

## 2015-03-08 NOTE — Telephone Encounter (Signed)
Pt states she was here yesterday and tested negative on strep.  She states she was throwing up all night due to the dry cough and wants help.  Pt ph [phone]

## 2015-03-09 ENCOUNTER — Ambulatory Visit (INDEPENDENT_AMBULATORY_CARE_PROVIDER_SITE_OTHER): Payer: BLUE CROSS/BLUE SHIELD | Admitting: Family Medicine

## 2015-03-09 ENCOUNTER — Encounter: Payer: Self-pay | Admitting: Family Medicine

## 2015-03-09 VITALS — BP 110/70 | HR 90 | Temp 98.3°F

## 2015-03-09 DIAGNOSIS — J029 Acute pharyngitis, unspecified: Secondary | ICD-10-CM

## 2015-03-09 DIAGNOSIS — J069 Acute upper respiratory infection, unspecified: Secondary | ICD-10-CM | POA: Diagnosis not present

## 2015-03-09 LAB — POCT RAPID STREP A (OFFICE): RAPID STREP A SCREEN: NEGATIVE

## 2015-03-09 NOTE — Progress Notes (Signed)
   Subjective:    Patient ID: Marilyn PinesNicole Hunter, female    DOB: 11-May-1990, 25 y.o.   MRN: [id number]  HPI Since last being seen she has now developed bilateral little pain as well as continued difficulty with sore throat and coughing with fever, chills, myalgias she continues on Tessalon and Emetrol. She notes that she has been exposed to other people with a viral syndrome.   Review of Systems     Objective:   Physical Exam Alert and in no distress. Tympanic membranes and canals are normal. Pharyngeal area is normal. Neck is supple without adenopathy or thyromegaly. Cardiac exam shows a regular sinus rhythm without murmurs or gallops. Lungs are clear to auscultation. Strep screen is again negative       Assessment & Plan:  Acute pharyngitis, unspecified pharyngitis type - Plan: Rapid Strep A  Viral URI  Supportive care including continue on her NSAID, cough suppressant and Emetrol. She is comfortable with this approach.

## 2015-03-10 ENCOUNTER — Telehealth: Payer: Self-pay | Admitting: Family Medicine

## 2015-03-10 MED ORDER — ONDANSETRON HCL 4 MG PO TABS: 4.0000 mg | ORAL_TABLET | Freq: Three times a day (TID) | ORAL | Status: DC | PRN

## 2015-03-10 NOTE — Telephone Encounter (Signed)
Left a detailed message on pt voicemail 

## 2015-03-10 NOTE — Telephone Encounter (Signed)
Call and let her know that I called in the medicine similar to her other nausea medicine but one that will not sedate her.

## 2015-03-10 NOTE — Telephone Encounter (Signed)
Pt's husband called and stated that pt is still very sick and not much better. She continues to have issues with nausea. He states that last night she took a phenergan and it helped her. He is requesting that medication be called in for her. Pt uses [facility] [address].

## 2015-03-12 ENCOUNTER — Telehealth: Payer: Self-pay | Admitting: Family Medicine

## 2015-03-23 ENCOUNTER — Telehealth: Payer: Self-pay | Admitting: Internal Medicine

## 2015-03-23 ENCOUNTER — Other Ambulatory Visit: Payer: Self-pay

## 2015-03-23 MED ORDER — SULFAMETHOXAZOLE-TRIMETHOPRIM 800-160 MG PO TABS: 1.0000 | ORAL_TABLET | Freq: Two times a day (BID) | ORAL | Status: DC

## 2015-03-23 NOTE — Telephone Encounter (Signed)
Sent in septra ds BID # 6 per [employer]

## 2015-03-23 NOTE — Telephone Encounter (Signed)
Pt called and has to leave for a school presentation and needs med sent by 12:30pm as she is uncomfortable

## 2015-03-23 NOTE — Telephone Encounter (Signed)
Pt called stating she woke up early this morning with a bladder infection. She went and got urine strips for bladder infection and it came out positive for infection. She did take Azo and it seems to have helped some but would like something sent in. [facility] [address]

## 2015-03-29 NOTE — Telephone Encounter (Signed)
Left message to call back  

## 2015-03-29 NOTE — Telephone Encounter (Signed)
P.A. Drema PryFrova denied, pt needs trial generic of either Sumatriptan, Naratriptan, Rizatriptan Do you want to switch?

## 2015-03-29 NOTE — Telephone Encounter (Signed)
See if she's tried any other migraine meds and if not call in sumatriptan

## 2015-04-12 NOTE — Telephone Encounter (Signed)
Left another message for pt.

## 2015-04-18 ENCOUNTER — Encounter: Payer: Self-pay | Admitting: Nurse Practitioner

## 2015-04-18 ENCOUNTER — Ambulatory Visit (INDEPENDENT_AMBULATORY_CARE_PROVIDER_SITE_OTHER): Payer: BLUE CROSS/BLUE SHIELD | Admitting: Nurse Practitioner

## 2015-04-18 VITALS — BP 108/66 | HR 64 | Ht 66.25 in | Wt 153.0 lb

## 2015-04-18 DIAGNOSIS — Z3009 Encounter for other general counseling and advice on contraception: Secondary | ICD-10-CM | POA: Diagnosis not present

## 2015-04-18 DIAGNOSIS — Z01419 Encounter for gynecological examination (general) (routine) without abnormal findings: Secondary | ICD-10-CM

## 2015-04-18 DIAGNOSIS — Z0000 Encounter for general adult medical examination without abnormal findings: Secondary | ICD-10-CM

## 2015-04-18 LAB — POCT URINALYSIS DIPSTICK
Bilirubin, UA: NEGATIVE
Glucose, UA: NEGATIVE
Ketones, UA: NEGATIVE
Leukocytes, UA: NEGATIVE
Nitrite, UA: NEGATIVE
PROTEIN UA: NEGATIVE
RBC UA: NEGATIVE
Urobilinogen, UA: NEGATIVE
pH, UA: 5.5

## 2015-04-18 NOTE — Patient Instructions (Signed)
General topics  Next pap or exam is  due in 1 year Take a Women's multivitamin Take 1200 mg. of calcium daily - prefer dietary If any concerns in interim to call back  Breast Self-Awareness Practicing breast self-awareness may pick up problems early, prevent significant medical complications, and possibly save your life. By practicing breast self-awareness, you can become familiar with how your breasts look and feel and if your breasts are changing. This allows you to notice changes early. It can also offer you some reassurance that your breast health is good. One way to learn what is normal for your breasts and whether your breasts are changing is to do a breast self-exam. If you find a lump or something that was not present in the past, it is best to contact your caregiver right away. Other findings that should be evaluated by your caregiver include nipple discharge, especially if it is bloody; skin changes or reddening; areas where the skin seems to be pulled in (retracted); or new lumps and bumps. Breast pain is seldom associated with cancer (malignancy), but should also be evaluated by a caregiver. BREAST SELF-EXAM The best time to examine your breasts is 5 7 days after your menstrual period is over.  ExitCare Patient Information 2013 ExitCare, LLC.   Exercise to Stay Healthy Exercise helps you become and stay healthy. EXERCISE IDEAS AND TIPS Choose exercises that:  You enjoy.  Fit into your day. You do not need to exercise really hard to be healthy. You can do exercises at a slow or medium level and stay healthy. You can:  Stretch before and after working out.  Try yoga, Pilates, or tai chi.  Lift weights.  Walk fast, swim, jog, run, climb stairs, bicycle, dance, or rollerskate.  Take aerobic classes. Exercises that burn about 150 calories:  Running 1  miles in 15 minutes.  Playing volleyball for 45 to 60 minutes.  Washing and waxing a car for 45 to 60  minutes.  Playing touch football for 45 minutes.  Walking 1  miles in 35 minutes.  Pushing a stroller 1  miles in 30 minutes.  Playing basketball for 30 minutes.  Raking leaves for 30 minutes.  Bicycling 5 miles in 30 minutes.  Walking 2 miles in 30 minutes.  Dancing for 30 minutes.  Shoveling snow for 15 minutes.  Swimming laps for 20 minutes.  Walking up stairs for 15 minutes.  Bicycling 4 miles in 15 minutes.  Gardening for 30 to 45 minutes.  Jumping rope for 15 minutes.  Washing windows or floors for 45 to 60 minutes. Document Released: 01/19/2011 Document Revised: 03/10/2012 Document Reviewed: 01/19/2011 ExitCare Patient Information 2013 ExitCare[employer].   Other topics ( that may be useful information):    Sexually Transmitted Disease Sexually transmitted disease (STD) refers to any infection that is passed from person to person during sexual activity. This may happen by way of saliva, semen, blood, vaginal mucus, or urine. Common STDs include:  Gonorrhea.  Chlamydia.  Syphilis.  HIV/AIDS.  Genital herpes.  Hepatitis B and C.  Trichomonas.  Human papillomavirus (HPV).  Pubic lice. CAUSES  An STD may be spread by bacteria, virus, or parasite. A person can get an STD by:  Sexual intercourse with an infected person.  Sharing sex toys with an infected person.  Sharing needles with an infected person.  Having intimate contact with the genitals, mouth, or rectal areas of an infected person. SYMPTOMS  Some people may not have any symptoms, but   they can still pass the infection to others. Different STDs have different symptoms. Symptoms include:  Painful or bloody urination.  Pain in the pelvis, abdomen, vagina, anus, throat, or eyes.  Skin rash, itching, irritation, growths, or sores (lesions). These usually occur in the genital or anal area.  Abnormal vaginal discharge.  Penile discharge in men.  Soft, flesh-colored skin growths in the  genital or anal area.  Fever.  Pain or bleeding during sexual intercourse.  Swollen glands in the groin area.  Yellow skin and eyes (jaundice). This is seen with hepatitis. DIAGNOSIS  To make a diagnosis, your caregiver may:  Take a medical history.  Perform a physical exam.  Take a specimen (culture) to be examined.  Examine a sample of discharge under a microscope.  Perform blood test TREATMENT   Chlamydia, gonorrhea, trichomonas, and syphilis can be cured with antibiotic medicine.  Genital herpes, hepatitis, and HIV can be treated, but not cured, with prescribed medicines. The medicines will lessen the symptoms.  Genital warts from HPV can be treated with medicine or by freezing, burning (electrocautery), or surgery. Warts may come back.  HPV is a virus and cannot be cured with medicine or surgery.However, abnormal areas may be followed very closely by your caregiver and may be removed from the cervix, vagina, or vulva through office procedures or surgery. If your diagnosis is confirmed, your recent sexual partners need treatment. This is true even if they are symptom-free or have a negative culture or evaluation. They should not have sex until their caregiver says it is okay. HOME CARE INSTRUCTIONS  All sexual partners should be informed, tested, and treated for all STDs.  Take your antibiotics as directed. Finish them even if you start to feel better.  Only take over-the-counter or prescription medicines for pain, discomfort, or fever as directed by your caregiver.  Rest.  Eat a balanced diet and drink enough fluids to keep your urine clear or pale yellow.  Do not have sex until treatment is completed and you have followed up with your caregiver. STDs should be checked after treatment.  Keep all follow-up appointments, Pap tests, and blood tests as directed by your caregiver.  Only use latex condoms and water-soluble lubricants during sexual activity. Do not use  petroleum jelly or oils.  Avoid alcohol and illegal drugs.  Get vaccinated for HPV and hepatitis. If you have not received these vaccines in the past, talk to your caregiver about whether one or both might be right for you.  Avoid risky sex practices that can break the skin. The only way to avoid getting an STD is to avoid all sexual activity.Latex condoms and dental dams (for oral sex) will help lessen the risk of getting an STD, but will not completely eliminate the risk. SEEK MEDICAL CARE IF:   You have a fever.  You have any new or worsening symptoms. Document Released: 03/09/2003 Document Revised: 03/10/2012 Document Reviewed: 03/16/2011 [facility] Patient Information 2013 [employer].    Domestic Abuse You are being battered or abused if someone close to you hits, pushes, or physically hurts you in any way. You also are being abused if you are forced into activities. You are being sexually abused if you are forced to have sexual contact of any kind. You are being emotionally abused if you are made to feel worthless or if you are constantly threatened. It is important to remember that help is available. No one has the right to abuse you. PREVENTION OF FURTHER  ABUSE  Learn the warning signs of danger. This varies with situations but may include: the use of alcohol, threats, isolation from friends and family, or forced sexual contact. Leave if you feel that violence is going to occur.  If you are attacked or beaten, report it to the police so the abuse is documented. You do not have to press charges. The police can protect you while you or the attackers are leaving. Get the officer's name and badge number and a copy of the report.  Find someone you can trust and tell them what is happening to you: your caregiver, a nurse, clergy member, close friend or family member. Feeling ashamed is natural, but remember that you have done nothing wrong. No one deserves abuse. Document Released:  12/14/2000 Document Revised: 03/10/2012 Document Reviewed: 02/22/2011 ExitCare Patient Information 2013 [employer]Care[employer].    How Much is Too Much Alcohol? Drinking too much alcohol can cause injury, accidents, and health problems. These types of problems can include:   Car crashes.  Falls.  Family fighting (domestic violence).  Drowning.  Fights.  Injuries.  Burns.  Damage to certain organs.  Having a baby with birth defects. ONE DRINK CAN BE TOO MUCH WHEN YOU ARE:  Working.  Pregnant or breastfeeding.  Taking medicines. Ask your doctor.  Driving or planning to drive. If you or someone you know has a drinking problem, get help from a doctor.  Document Released: 10/13/2009 Document Revised: 03/10/2012 Document Reviewed: 10/13/2009 ExitCare Patient Information 2013 ExitCare[employer].   Smoking Hazards Smoking cigarettes is extremely bad for your health. Tobacco smoke has over 200 known poisons in it. There are over 60 chemicals in tobacco smoke that cause cancer. Some of the chemicals found in cigarette smoke include:   Cyanide.  Benzene.  Formaldehyde.  Methanol (wood alcohol).  Acetylene (fuel used in welding torches).  Ammonia. Cigarette smoke also contains the poisonous gases nitrogen oxide and carbon monoxide.  Cigarette smokers have an increased risk of many serious medical problems and Smoking causes approximately:  90% of all lung cancer deaths in men.  80% of all lung cancer deaths in women.  90% of deaths from chronic obstructive lung disease. Compared with nonsmokers, smoking increases the risk of:  Coronary heart disease by 2 to 4 times.  Stroke by 2 to 4 times.  Men developing lung cancer by 23 times.  Women developing lung cancer by 13 times.  Dying from chronic obstructive lung diseases by 12 times.  . Smoking is the most preventable cause of death and disease in our society.  WHY IS SMOKING ADDICTIVE?  Nicotine is the chemical  agent in tobacco that is capable of causing addiction or dependence.  When you smoke and inhale, nicotine is absorbed rapidly into the bloodstream through your lungs. Nicotine absorbed through the lungs is capable of creating a powerful addiction. Both inhaled and non-inhaled nicotine may be addictive.  Addiction studies of cigarettes and spit tobacco show that addiction to nicotine occurs mainly during the teen years, when young people begin using tobacco products. WHAT ARE THE BENEFITS OF QUITTING?  There are many health benefits to quitting smoking.   Likelihood of developing cancer and heart disease decreases. Health improvements are seen almost immediately.  Blood pressure, pulse rate, and breathing patterns start returning to normal soon after quitting. QUITTING SMOKING   American Lung Association - 1-800-LUNGUSA  American Cancer Society - 1-800-ACS-2345 Document Released: 01/24/2005 Document Revised: 03/10/2012 Document Reviewed: 09/28/2009 ExitCare Patient Information 2013 ExitCare,   LLC.   Stress Management Stress is a state of physical or mental tension that often results from changes in your life or normal routine. Some common causes of stress are:  Death of a loved one.  Injuries or severe illnesses.  Getting fired or changing jobs.  Moving into a new home. Other causes may be:  Sexual problems.  Business or financial losses.  Taking on a large debt.  Regular conflict with someone at home or at work.  Constant tiredness from lack of sleep. It is not just bad things that are stressful. It may be stressful to:  Win the lottery.  Get married.  Buy a new car. The amount of stress that can be easily tolerated varies from person to person. Changes generally cause stress, regardless of the types of change. Too much stress can affect your health. It may lead to physical or emotional problems. Too little stress (boredom) may also become stressful. SUGGESTIONS TO  REDUCE STRESS:  Talk things over with your family and friends. It often is helpful to share your concerns and worries. If you feel your problem is serious, you may want to get help from a professional counselor.  Consider your problems one at a time instead of lumping them all together. Trying to take care of everything at once may seem impossible. List all the things you need to do and then start with the most important one. Set a goal to accomplish 2 or 3 things each day. If you expect to do too many in a single day you will naturally fail, causing you to feel even more stressed.  Do not use alcohol or drugs to relieve stress. Although you may feel better for a short time, they do not remove the problems that caused the stress. They can also be habit forming.  Exercise regularly - at least 3 times per week. Physical exercise can help to relieve that "uptight" feeling and will relax you.  The shortest distance between despair and hope is often a good night's sleep.  Go to bed and get up on time allowing yourself time for appointments without being rushed.  Take a short "time-out" period from any stressful situation that occurs during the day. Close your eyes and take some deep breaths. Starting with the muscles in your face, tense them, hold it for a few seconds, then relax. Repeat this with the muscles in your neck, shoulders, hand, stomach, back and legs.  Take good care of yourself. Eat a balanced diet and get plenty of rest.  Schedule time for having fun. Take a break from your daily routine to relax. HOME CARE INSTRUCTIONS   Call if you feel overwhelmed by your problems and feel you can no longer manage them on your own.  Return immediately if you feel like hurting yourself or someone else. Document Released: 06/12/2001 Document Revised: 03/10/2012 Document Reviewed: 02/02/2008 ExitCare Patient Information 2013 ExitCare, LLC.  

## 2015-04-18 NOTE — Progress Notes (Signed)
Patient ID: Marilyn Hunter, female   DOB: 12/10/1990, 25 y.o.   MRN: [id number] 25 y.o. G0P0 Married  Caucasian Fe here for annual exam.  While on birth control no improvement with continuous active Nuva ring.  Stopped birth control about 8 months ago.  Headaches are better in general but still gets HA's with cycle.  Symptoms are controlled with Frova.  Condoms for birth control.  Would like to consider progesterone only options.  Patient's last menstrual period was 04/06/2015 (approximate).          Sexually active: Yes.    The current method of family planning is condoms all of the time.    Exercising: No.  cardio when pt has time Smoker:  no  Health Maintenance: Pap:  10/08/12, negative, history of ASCUS with pos HR HPV 2006 TDaP:  10/06/12  Labs: HB:  12.7  Urine:  normal   reports that she has never smoked. She has never used smokeless tobacco. She reports that she drinks about 0.5 - 1.0 oz of alcohol per week. She reports that she does not use illicit drugs.  Past Medical History  Diagnosis Date  . Migraines     chronic  . Allergic rhinitis, cause unspecified   . Abnormal Pap smear 2006    ASCUS, pos HR HPV    Past Surgical History  Procedure Laterality Date  . Tonsillectomy  age 12  . Foot surgery  L 01/2012; R 02/2012    bilateral taylor bunionectomies, and similar to regular bunionectomy surgeries (1st and 5th metatarsals bilaterally)    Current Outpatient Prescriptions  Medication Sig Dispense Refill  . aspirin-acetaminophen-caffeine (EXCEDRIN MIGRAINE) 250-250-65 MG per tablet Take 1 tablet by mouth every 6 (six) hours as needed.      . FROVA 2.5 MG tablet Take 1 tablet if needed, if recurs may repeat after 2 hours.  Max 3 tabs/24 hours  1  . Multiple Vitamins-Minerals (MULTI-VITAMIN GUMMIES PO) Take 1 each by mouth daily.     No current facility-administered medications for this visit.    Family History  Problem Relation Age of Onset  . Hodgkin's lymphoma Mother 22   . Thyroid cancer Mother     (from radiation/hodgkin's treatment)  . Breast cancer Mother 44    possibly related to radiation treatment for hodgkins  BRCA test is negative  . Cancer Mother     hodgkin's; thyroid; breast; now new tumor in thoracic spine from possible breast cancer.  . Diabetes Neg Hx   . Heart disease Neg Hx   . Hyperlipidemia Maternal Grandfather   . Cervical cancer Maternal Aunt 53    stage III - clinical trial - BRCA negative    ROS:  Pertinent items are noted in HPI.  Otherwise, a comprehensive ROS was negative.  Exam:   BP 108/66 mmHg  Pulse 64  Ht 5' 6.25" (1.683 m)  Wt 153 lb (69.4 kg)  BMI 24.50 kg/m2  LMP 04/06/2015 (Approximate) Height: 5' 6.25" (168.3 cm) Ht Readings from Last 3 Encounters:  04/18/15 5' 6.25" (1.683 m)  11/11/13 5' 7" (1.702 m)  10/02/12 5' 7" (1.702 m)    General appearance: alert, cooperative and appears stated age Head: Normocephalic, without obvious abnormality, atraumatic Neck: no adenopathy, supple, symmetrical, trachea midline and thyroid normal to inspection and palpation Lungs: clear to auscultation bilaterally Breasts: normal appearance, no masses or tenderness Heart: regular rate and rhythm Abdomen: soft, non-tender; no masses,  no organomegaly Extremities: extremities normal, atraumatic, no cyanosis or   edema Skin: Skin color, texture, turgor normal. No rashes or lesions Lymph nodes: Cervical, supraclavicular, and axillary nodes normal. No abnormal inguinal nodes palpated Neurologic: Grossly normal   Pelvic: External genitalia:  no lesions              Urethra:  normal appearing urethra with no masses, tenderness or lesions              Bartholin's and Skene's: normal                 Vagina: normal appearing vagina with normal color and discharge, no lesions              Cervix: anteverted              Pap taken: Yes.   Bimanual Exam:  Uterus:  normal size, contour, position, consistency, mobility, non-tender               Adnexa: no mass, fullness, tenderness               Rectovaginal: Confirms               Anus:  normal sphincter tone, no lesions  Chaperone present: yes  A:  Well Woman with normal exam  Contraception with condoms History of migraine headaches with Aura   P:   Reviewed health and wellness pertinent to exam  Pap smear taken today  Counseled about POP, Mirena IUD, Skyla IUD, Implanon  She is very interested in [place] as long as HA's were not bothered  Order for [facility] IUD is placed and she is aware of consult visit prior to getting done.  Information packet is given  Counseled on breast self exam, family planning choices, adequate intake of calcium and vitamin D, diet and exercise return annually or prn  An After Visit Summary was printed and given to the patient.

## 2015-04-20 ENCOUNTER — Telehealth: Payer: Self-pay | Admitting: Nurse Practitioner

## 2015-04-20 NOTE — Telephone Encounter (Signed)
Spoke with patient. Advised of benefits quote received for IUD and insertion. Patient agreeable. Patient is to call within the first 5 days of her cycle to schedule insertion.  [facility]

## 2015-04-20 NOTE — Telephone Encounter (Signed)
Spoke with patient. Consult appointment for IUD insertion scheduled for 4/25 at 11:30am with Dr.Silva. Patient is agreeable to date and time.  Routing to provider for final review. Patient agreeable to disposition. Will close encounter

## 2015-04-21 LAB — IPS PAP TEST WITH HPV

## 2015-04-21 LAB — HEMOGLOBIN, FINGERSTICK: Hemoglobin, fingerstick: 12.7 g/dL (ref 12.0–16.0)

## 2015-04-21 NOTE — Progress Notes (Signed)
Encounter reviewed by Dr. Brook Silva.  

## 2015-04-25 ENCOUNTER — Encounter: Payer: Self-pay | Admitting: Obstetrics and Gynecology

## 2015-04-25 ENCOUNTER — Ambulatory Visit (INDEPENDENT_AMBULATORY_CARE_PROVIDER_SITE_OTHER): Payer: BLUE CROSS/BLUE SHIELD | Admitting: Obstetrics and Gynecology

## 2015-04-25 VITALS — BP 110/74 | HR 70 | Ht 66.25 in | Wt 152.2 lb

## 2015-04-25 DIAGNOSIS — Z308 Encounter for other contraceptive management: Secondary | ICD-10-CM

## 2015-04-25 NOTE — Progress Notes (Signed)
GYNECOLOGY  VISIT   HPI: 25 y.o.   Married  Caucasian  female   G0P0 with Patient's last menstrual period was 04/06/2015 (approximate).   here for IUD consult of Skyla   Has migraine with aura.  Tired NuvaRing and continuous OPCs.   Had continuous cycles with the continuous OCPs.  Uses condoms now and headaches are better since stopping NuvaRing.  Headaches are now improved during her cycle now.  Menses can occur a week early from time to time.   GYNECOLOGIC HISTORY: Patient's last menstrual period was 04/06/2015 (approximate). Contraception:   Condoms Menopausal hormone therapy: None Last Pap: 04/18/15 NEG HR HPV negative        OB History    Gravida Para Term Preterm AB TAB SAB Ectopic Multiple Living   0 0                 Patient Active Problem List   Diagnosis Date Noted  . Menstrual migraine without status migrainosus, not intractable 03/07/2015    Past Medical History  Diagnosis Date  . Migraines     chronic  . Allergic rhinitis, cause unspecified   . Abnormal Pap smear 2006    ASCUS, pos HR HPV    Past Surgical History  Procedure Laterality Date  . Tonsillectomy  age 45  . Foot surgery  L 01/2012; R 02/2012    bilateral taylor bunionectomies, and similar to regular bunionectomy surgeries (1st and 5th metatarsals bilaterally)    Current Outpatient Prescriptions  Medication Sig Dispense Refill  . aspirin-acetaminophen-caffeine (EXCEDRIN MIGRAINE) 250-250-65 MG per tablet Take 1 tablet by mouth every 6 (six) hours as needed.      . FROVA 2.5 MG tablet Take 1 tablet if needed, if recurs may repeat after 2 hours.  Max 3 tabs/24 hours  1  . Multiple Vitamins-Minerals (MULTI-VITAMIN GUMMIES PO) Take 1 each by mouth daily.     No current facility-administered medications for this visit.     ALLERGIES: Review of patient's allergies indicates no known allergies.  Family History  Problem Relation Age of Onset  . Hodgkin's lymphoma Mother 76  . Thyroid cancer  Mother     (from radiation/hodgkin's treatment)  . Breast cancer Mother 57    possibly related to radiation treatment for hodgkins  BRCA test is negative  . Cancer Mother     hodgkin's; thyroid; breast; now new tumor in thoracic spine from possible breast cancer.  . Diabetes Neg Hx   . Heart disease Neg Hx   . Hyperlipidemia Maternal Grandfather   . Cervical cancer Maternal Aunt 53    stage III - clinical trial - BRCA negative    History   Social History  . Marital Status: Married    Spouse Name: N/A  . Number of Children: 0  . Years of Education: N/A   Occupational History  . paralegal    Social History Main Topics  . Smoking status: Never Smoker   . Smokeless tobacco: Never Used  . Alcohol Use: 0.5 - 1.0 oz/week    1-2 Standard drinks or equivalent per week     Comment: 5 drinks in a month  . Drug Use: No  . Sexual Activity:    Partners: Male    Birth Control/ Protection: Inserts     Comment: NuvaRing   Other Topics Concern  . 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 Lives  with her husband.    ROS:  Pertinent items are noted in HPI.  PHYSICAL EXAMINATION:    BP 110/74 mmHg  Pulse 70  Ht 5' 6.25" (1.683 m)  Wt 152 lb 3.2 oz (69.037 kg)  BMI 24.37 kg/m2  LMP 04/06/2015 (Approximate)     General appearance: alert, cooperative and appears stated age  Pelvic: External genitalia:  no lesions              Urethra:  normal appearing urethra with no masses, tenderness or lesions              Bartholins and Skenes: normal                 Vagina: normal appearing vagina with normal color and discharge, no lesions              Cervix: normal appearance                   Bimanual Exam:  Uterus:  uterus is normal size, shape, consistency and nontender                                      Adnexa: normal adnexa in size, nontender and no masses                                      ASSESSMENT  Need  for contraception.  Migraine with aura.   PLAN  Discussed long acting contraception - Depo provera, Nexplanon, ParaGard IUD, and Skyla.  Focused discussion on ParaGard IUD and Skyla IUD - risks and benefits of each.  Handouts on each of these IUD types.  Patient will call with her choice and will insert during menses. IUD insertion explained.  Will do paracervical block and course of Cytotec 200 mcg po the evening prior to insertion and repeat the dose the am of procedure.    An After Visit Summary was printed and given to the patient.  __25____ minutes face to face time of which over 50% was spent in counseling.

## 2015-04-27 ENCOUNTER — Telehealth: Payer: Self-pay | Admitting: Obstetrics and Gynecology

## 2015-04-27 DIAGNOSIS — Z30430 Encounter for insertion of intrauterine contraceptive device: Secondary | ICD-10-CM

## 2015-04-27 NOTE — Telephone Encounter (Signed)
Spoke with patient. She states she had a consult done with Dr. Edward JollySilva and now has decided she would like to have the Paragard placed.  She would like to have her benefits rechecked for coverage of Paragard.  Advised patient to call back with first day of menses for instructions and cytotec order.  Advised would place order for paragard and request that insurance coordinator call her back. Patient agreeable.   Routing to provider for final review. Patient agreeable to disposition. Will close encounter   cc Cathrine MusterSabrina Franklin

## 2015-04-27 NOTE — Telephone Encounter (Signed)
Patient decided that she wants to have the paraguard procedure.

## 2015-04-27 NOTE — Telephone Encounter (Signed)
Call to patient. Advised of benefit quote received for Paraguard and insertion.  Patient is to call with cycle. Patient agreeable.

## 2015-05-06 ENCOUNTER — Telehealth: Payer: Self-pay | Admitting: Obstetrics and Gynecology

## 2015-05-06 MED ORDER — MISOPROSTOL 200 MCG PO TABS: ORAL_TABLET | ORAL | Status: DC

## 2015-05-06 NOTE — Telephone Encounter (Signed)
Pt states she is in process of getting iud with dr Edward Jollysilva. States she started her cycle this morning.  Best: [phone]

## 2015-05-06 NOTE — Telephone Encounter (Signed)
Spoke with patient. Patient started her cycle today 5/6 and would like to schedule IUD insertion. Appointment scheduled for 05/11/2015 at 3pm with Dr.Silva. Patient is agreeable to date and time. Pre procedure instructions given.  Motrin instructions given. Motrin=Advil=Ibuprofen, 800 mg one hour before appointment. Eat a meal and hydrate well before appointment. Cytotec instructions given. Cytotec 200 mcg #2 0RF sent to [facility] off [address]. Patient is agreeable.  Routing to provider for final review. Patient agreeable to disposition. Patient aware provider will review message and nurse will return call with any additional instructions or change of disposition. Will close encounter.

## 2015-05-11 ENCOUNTER — Ambulatory Visit (INDEPENDENT_AMBULATORY_CARE_PROVIDER_SITE_OTHER): Payer: BLUE CROSS/BLUE SHIELD | Admitting: Obstetrics and Gynecology

## 2015-05-11 ENCOUNTER — Encounter: Payer: Self-pay | Admitting: Obstetrics and Gynecology

## 2015-05-11 VITALS — BP 108/70 | HR 80 | Resp 16 | Ht 66.25 in | Wt 151.0 lb

## 2015-05-11 DIAGNOSIS — Z30430 Encounter for insertion of intrauterine contraceptive device: Secondary | ICD-10-CM | POA: Diagnosis not present

## 2015-05-11 DIAGNOSIS — Z3009 Encounter for other general counseling and advice on contraception: Secondary | ICD-10-CM

## 2015-05-11 LAB — POCT URINE PREGNANCY: Preg Test, Ur: NEGATIVE

## 2015-05-11 NOTE — Progress Notes (Signed)
GYNECOLOGY  VISIT   HPI: 25 y.o.   Married  Caucasian  female   G0P0 with Patient's last menstrual period was 05/06/2015.   here for   IUD Insertion - ParaGard IUD.  Current contraception is condoms.   Took Motrin and Cytotec in preparation for visit today.   UPT negative.   GYNECOLOGIC HISTORY: Patient's last menstrual period was 05/06/2015. Contraception: Condoms  Menopausal hormone therapy: None Last mammogram: Never Last pap smear: 04/18/15 Neg. HR HPV:Neg        OB History    Gravida Para Term Preterm AB TAB SAB Ectopic Multiple Living   0 0                 Patient Active Problem List   Diagnosis Date Noted  . Menstrual migraine without status migrainosus, not intractable 03/07/2015    Past Medical History  Diagnosis Date  . Migraines     chronic  . Allergic rhinitis, cause unspecified   . Abnormal Pap smear 2006    ASCUS, pos HR HPV    Past Surgical History  Procedure Laterality Date  . Tonsillectomy  age 27  . Foot surgery  L 01/2012; R 02/2012    bilateral taylor bunionectomies, and similar to regular bunionectomy surgeries (1st and 5th metatarsals bilaterally)    Current Outpatient Prescriptions  Medication Sig Dispense Refill  . aspirin-acetaminophen-caffeine (EXCEDRIN MIGRAINE) 250-250-65 MG per tablet Take 1 tablet by mouth every 6 (six) hours as needed.      . FROVA 2.5 MG tablet Take 1 tablet if needed, if recurs may repeat after 2 hours.  Max 3 tabs/24 hours  1  . misoprostol (CYTOTEC) 200 MCG tablet Take one tablet the night before procedure and one tablet the morning of procedure. 2 tablet 0  . Multiple Vitamins-Minerals (MULTI-VITAMIN GUMMIES PO) Take 1 each by mouth daily.     No current facility-administered medications for this visit.     ALLERGIES: Review of patient's allergies indicates no known allergies.  Family History  Problem Relation Age of Onset  . Hodgkin's lymphoma Mother 97  . Thyroid cancer Mother     (from  radiation/hodgkin's treatment)  . Breast cancer Mother 20    possibly related to radiation treatment for hodgkins  BRCA test is negative  . Cancer Mother     hodgkin's; thyroid; breast; now new tumor in thoracic spine from possible breast cancer.  . Diabetes Neg Hx   . Heart disease Neg Hx   . Hyperlipidemia Maternal Grandfather   . Cervical cancer Maternal Aunt 53    stage III - clinical trial - BRCA negative    History   Social History  . Marital Status: Married    Spouse Name: N/A  . Number of Children: 0  . Years of Education: N/A   Occupational History  . paralegal    Social History Main Topics  . Smoking status: Never Smoker   . Smokeless tobacco: Never Used  . Alcohol Use: 0.5 - 1.0 oz/week    1-2 Standard drinks or equivalent per week     Comment: 5 drinks in a month  . Drug Use: No  . Sexual Activity:    Partners: Male    Birth Control/ Protection: Condom   Other Topics Concern  . 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 Lives with her husband.    ROS:  Pertinent items are  noted in HPI.  PHYSICAL EXAMINATION:    BP 108/70 mmHg  Pulse 80  Resp 16  Ht 5' 6.25" (1.683 m)  Wt 151 lb (68.493 kg)  BMI 24.18 kg/m2  LMP 05/06/2015     General appearance: alert, cooperative and appears stated age    Pelvic: External genitalia:  no lesions              Urethra:  normal appearing urethra with no masses, tenderness or lesions              Bartholins and Skenes: normal                 Vagina: normal appearing vagina with normal color and discharge, no lesions              Cervix: normal appearance                   Bimanual Exam:  Uterus:  uterus is normal size, shape, consistency and nontender                                      Adnexa: normal adnexa in size, nontender and no masses                         ParaGard IUD insertion.  Lot number - [id number], Exp. April 2022 Consent for  procedure.  Speculum placed in vagina.  Sterile prep with betadine.  Local 1% lidocaine 10 cc - lot number - [id number], Exp. 01/01/16 Tenaculum to anterior cervix.  Uterus sounded to 7 cm.  ParaGard IUD placed without difficulty.  Strings trimmed.  Repeat pelvic exam - no change.  No complications.  Minimal EBL.              ASSESSMENT  ParaGard IUD insertion.    PLAN  Instructions and precautions given.  Use back up protection for one month. IUD due for removal in 10 years, sooner as needed. Follow up in 1 months for recheck, sooner as needed.   An After Visit Summary was printed and given to the patient.

## 2015-06-20 ENCOUNTER — Ambulatory Visit (INDEPENDENT_AMBULATORY_CARE_PROVIDER_SITE_OTHER): Payer: BLUE CROSS/BLUE SHIELD | Admitting: Obstetrics and Gynecology

## 2015-06-20 ENCOUNTER — Encounter: Payer: Self-pay | Admitting: Obstetrics and Gynecology

## 2015-06-20 VITALS — BP 110/62 | HR 84 | Resp 16 | Ht 66.25 in | Wt 154.0 lb

## 2015-06-20 DIAGNOSIS — Z30431 Encounter for routine checking of intrauterine contraceptive device: Secondary | ICD-10-CM

## 2015-06-20 NOTE — Progress Notes (Signed)
Patient ID: Marilyn Hunter, female   DOB: August 13, 1990, 25 y.o.   MRN: [id number] GYNECOLOGY  VISIT   HPI: 25 y.o.   Married  Caucasian  female   G0P0 with Patient's last menstrual period was 06/06/2015.   here for 5 weeks follow up after Paragard IUD insertion in 05/11/15. First menses post insertion was heavier than usual but manageable.  Intercourse not painful.   GYNECOLOGIC HISTORY: Patient's last menstrual period was 06/06/2015. Contraception:Paragard IUD Menopausal hormone therapy: n/a Last mammogram: n/a Last pap smear: 04-18-15 wnl:neg HR HPV        OB History    Gravida Para Term Preterm AB TAB SAB Ectopic Multiple Living   0 0                 Patient Active Problem List   Diagnosis Date Noted  . Menstrual migraine without status migrainosus, not intractable 03/07/2015    Past Medical History  Diagnosis Date  . Migraines     chronic  . Allergic rhinitis, cause unspecified   . Abnormal Pap smear 2006    ASCUS, pos HR HPV    Past Surgical History  Procedure Laterality Date  . Tonsillectomy  age 40  . Foot surgery  L 01/2012; R 02/2012    bilateral taylor bunionectomies, and similar to regular bunionectomy surgeries (1st and 5th metatarsals bilaterally)    Current Outpatient Prescriptions  Medication Sig Dispense Refill  . aspirin-acetaminophen-caffeine (EXCEDRIN MIGRAINE) 250-250-65 MG per tablet Take 1 tablet by mouth every 6 (six) hours as needed.      . FROVA 2.5 MG tablet Take 1 tablet if needed, if recurs may repeat after 2 hours.  Max 3 tabs/24 hours  1  . Multiple Vitamins-Minerals (MULTI-VITAMIN GUMMIES PO) Take 1 each by mouth daily.     No current facility-administered medications for this visit.     ALLERGIES: Review of patient's allergies indicates no known allergies.  Family History  Problem Relation Age of Onset  . Hodgkin's lymphoma Mother 80  . Thyroid cancer Mother     (from radiation/hodgkin's treatment)  . Breast cancer Mother 67   possibly related to radiation treatment for hodgkins  BRCA test is negative  . Cancer Mother     hodgkin's; thyroid; breast; now new tumor in thoracic spine from possible breast cancer.  . Diabetes Neg Hx   . Heart disease Neg Hx   . Hyperlipidemia Maternal Grandfather   . Cervical cancer Maternal Aunt 53    stage III - clinical trial - BRCA negative    History   Social History  . Marital Status: Married    Spouse Name: N/A  . Number of Children: 0  . Years of Education: N/A   Occupational History  . paralegal    Social History Main Topics  . Smoking status: Never Smoker   . Smokeless tobacco: Never Used  . Alcohol Use: 0.5 - 1.0 oz/week    1-2 Standard drinks or equivalent per week     Comment: 5 drinks in a month  . Drug Use: No  . Sexual Activity:    Partners: Male    Birth Control/ Protection: Condom   Other Topics Concern  . 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 Lives with her husband.    ROS:  Pertinent items are noted in HPI.  PHYSICAL EXAMINATION:    BP 110/62 mmHg  Pulse 84  Resp 16  Ht 5' 6.25" (1.683 m)  Wt 154 lb (69.854 kg)  BMI 24.66 kg/m2  LMP 06/06/2015    General appearance: alert, cooperative and appears stated age   Pelvic: External genitalia:  no lesions              Urethra:  normal appearing urethra with no masses, tenderness or lesions              Bartholins and Skenes: normal                 Vagina: normal appearing vagina with normal color and discharge, no lesions              Cervix: no lesions and IUD strings seen.              Bimanual Exam:  Uterus:  normal size, contour, position, consistency, mobility, non-tender              Adnexa: normal adnexa and no mass, fullness, tenderness                Chaperone was present for exam.  ASSESSMENT  ParaGard IUD patient.  Doing well post IUD placement.    PLAN  Counseled that IUD is working to provide  contraception.  Discussed again risk of ectopic with IUD so do UPT if misses menses and call right away with any positive test. Return for usual GYN annual exam and prn.    An After Visit Summary was printed and given to the patient.  ___15__ minutes face to face time of which over 50% was spent in counseling.

## 2015-09-27 ENCOUNTER — Other Ambulatory Visit: Payer: Self-pay | Admitting: Family Medicine

## 2015-09-27 NOTE — Telephone Encounter (Signed)
Is this okay to refill? 

## 2015-10-13 ENCOUNTER — Ambulatory Visit (INDEPENDENT_AMBULATORY_CARE_PROVIDER_SITE_OTHER): Payer: BLUE CROSS/BLUE SHIELD | Admitting: Medical

## 2015-10-13 VITALS — BP 102/62 | Temp 98.2°F | Wt 147.0 lb

## 2015-10-13 DIAGNOSIS — M778 Other enthesopathies, not elsewhere classified: Secondary | ICD-10-CM

## 2015-10-13 DIAGNOSIS — M7711 Lateral epicondylitis, right elbow: Secondary | ICD-10-CM | POA: Diagnosis not present

## 2015-10-13 MED ORDER — DICLOFENAC SODIUM 75 MG PO TBEC: 75.0000 mg | DELAYED_RELEASE_TABLET | Freq: Two times a day (BID) | ORAL | Status: DC

## 2015-10-13 MED ORDER — HYDROCODONE-ACETAMINOPHEN 5-325 MG PO TABS: 1.0000 | ORAL_TABLET | Freq: Four times a day (QID) | ORAL | Status: DC | PRN

## 2015-10-13 NOTE — Progress Notes (Signed)
Subjective: Chief Complaint  Patient presents with  . Pain in Rt arm    starts in middle finger and reaches to elbow. made it to where she cant sleep. it is starting to happen in her left arm as well. she states she has had carpel tunnel symptoms in the past but this is the worst its been. been taking aleve and ibuprofen, heating pad, and tried a splint she said it sort of helped put pain still there. no other concerns   Been having right arm pains for about 4 days.  No recent injury, trauma.  She notes prior minor carpal tunnel symptoms in the past.  She is on the computer all day long.   Been working more of late than normal.   This morning had some left arm tightness too.   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   Has used some ibuprofen, and heating pad.  Helped a little.  Used husband's reinforced CTS splint last night.   Right arm and to some extent left arm feels tight and sore.  No numbness or tingling.  Dull achy pain.  Feels like she can't close her first all the way on the right.   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  No other aggravating or relieving factors. No other complaint.  Past Medical History  Diagnosis Date  . Migraines     chronic  . Allergic rhinitis, cause unspecified   . Abnormal Pap smear 2006    ASCUS, pos HR HPV    Past Surgical History  Procedure Laterality Date  . Tonsillectomy  age 25  . Foot surgery  L 01/2012; R 02/2012    bilateral taylor bunionectomies, and similar to regular bunionectomy surgeries (1st and 5th metatarsals bilaterally)    ROS as in subjective   Objective: BP 102/62 mmHg  Temp(Src) 98.2 F (36.8 C)  Wt 147 lb (66.679 kg)  Gen: wd, wn, nad Skin: no erythema, skin color normal, no warmth MSK: tender over right lateral epicondyle, right wrist throughout, and tender right forearm.  Right wrist ROM  reduced to about 85% of normal.  Left arm nontender, normal ROM Ext: no UE edema UE pulses and cap refill normal Neuro: normal arm sensation, strength, and -phalens and tinels.    Assessment: Encounter Diagnoses  Name Primary?  . Right wrist tendinitis Yes  . Lateral epicondylitis (tennis elbow), right     Plan: Symptoms due to overuse.   Advised rest when possible off the computer.  Begin samples of Flector patch to right wrist/forearm BID, the reinforced home splint she has, ice, and hydrocodone prn for worse pain.   If not much improved within 2 wk, then recheck.

## 2016-02-01 ENCOUNTER — Ambulatory Visit (INDEPENDENT_AMBULATORY_CARE_PROVIDER_SITE_OTHER): Payer: BLUE CROSS/BLUE SHIELD | Admitting: Medical

## 2016-02-01 ENCOUNTER — Encounter: Payer: Self-pay | Admitting: Medical

## 2016-02-01 VITALS — BP 136/72 | HR 105 | Wt 154.0 lb

## 2016-02-01 DIAGNOSIS — M542 Cervicalgia: Secondary | ICD-10-CM

## 2016-02-01 DIAGNOSIS — M549 Dorsalgia, unspecified: Secondary | ICD-10-CM

## 2016-02-01 MED ORDER — DICLOFENAC SODIUM 75 MG PO TBEC: 75.0000 mg | DELAYED_RELEASE_TABLET | Freq: Two times a day (BID) | ORAL | Status: DC

## 2016-02-01 MED ORDER — METHOCARBAMOL 500 MG PO TABS: 500.0000 mg | ORAL_TABLET | Freq: Three times a day (TID) | ORAL | Status: DC | PRN
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
## 2016-04-20 ENCOUNTER — Encounter: Payer: Self-pay | Admitting: Nurse Practitioner

## 2016-04-20 ENCOUNTER — Ambulatory Visit (INDEPENDENT_AMBULATORY_CARE_PROVIDER_SITE_OTHER): Payer: BLUE CROSS/BLUE SHIELD | Admitting: Nurse Practitioner

## 2016-04-20 VITALS — BP 108/66 | HR 64 | Ht 66.5 in | Wt 154.0 lb

## 2016-04-20 DIAGNOSIS — Z01419 Encounter for gynecological examination (general) (routine) without abnormal findings: Secondary | ICD-10-CM

## 2016-04-20 DIAGNOSIS — Z0000 Encounter for general adult medical examination without abnormal findings: Secondary | ICD-10-CM | POA: Diagnosis not present

## 2016-04-20 NOTE — Patient Instructions (Signed)
General topics  Next pap or exam is  due in 1 year Take a Women's multivitamin Take 1200 mg. of calcium daily - prefer dietary If any concerns in interim to call back  Breast Self-Awareness Practicing breast self-awareness may pick up problems early, prevent significant medical complications, and possibly save your life. By practicing breast self-awareness, you can become familiar with how your breasts look and feel and if your breasts are changing. This allows you to notice changes early. It can also offer you some reassurance that your breast health is good. One way to learn what is normal for your breasts and whether your breasts are changing is to do a breast self-exam. If you find a lump or something that was not present in the past, it is best to contact your caregiver right away. Other findings that should be evaluated by your caregiver include nipple discharge, especially if it is bloody; skin changes or reddening; areas where the skin seems to be pulled in (retracted); or new lumps and bumps. Breast pain is seldom associated with cancer (malignancy), but should also be evaluated by a caregiver. BREAST SELF-EXAM The best time to examine your breasts is 5 7 days after your menstrual period is over.  ExitCare Patient Information 2013 ExitCare, LLC.   Exercise to Stay Healthy Exercise helps you become and stay healthy. EXERCISE IDEAS AND TIPS Choose exercises that:  You enjoy.  Fit into your day. You do not need to exercise really hard to be healthy. You can do exercises at a slow or medium level and stay healthy. You can:  Stretch before and after working out.  Try yoga, Pilates, or tai chi.  Lift weights.  Walk fast, swim, jog, run, climb stairs, bicycle, dance, or rollerskate.  Take aerobic classes. Exercises that burn about 150 calories:  Running 1  miles in 15 minutes.  Playing volleyball for 45 to 60 minutes.  Washing and waxing a car for 45 to 60  minutes.  Playing touch football for 45 minutes.  Walking 1  miles in 35 minutes.  Pushing a stroller 1  miles in 30 minutes.  Playing basketball for 30 minutes.  Raking leaves for 30 minutes.  Bicycling 5 miles in 30 minutes.  Walking 2 miles in 30 minutes.  Dancing for 30 minutes.  Shoveling snow for 15 minutes.  Swimming laps for 20 minutes.  Walking up stairs for 15 minutes.  Bicycling 4 miles in 15 minutes.  Gardening for 30 to 45 minutes.  Jumping rope for 15 minutes.  Washing windows or floors for 45 to 60 minutes. Document Released: 01/19/2011 Document Revised: 03/10/2012 Document Reviewed: 01/19/2011 ExitCare Patient Information 2013 ExitCare[employer].   Other topics ( that may be useful information):    Sexually Transmitted Disease Sexually transmitted disease (STD) refers to any infection that is passed from person to person during sexual activity. This may happen by way of saliva, semen, blood, vaginal mucus, or urine. Common STDs include:  Gonorrhea.  Chlamydia.  Syphilis.  HIV/AIDS.  Genital herpes.  Hepatitis B and C.  Trichomonas.  Human papillomavirus (HPV).  Pubic lice. CAUSES  An STD may be spread by bacteria, virus, or parasite. A person can get an STD by:  Sexual intercourse with an infected person.  Sharing sex toys with an infected person.  Sharing needles with an infected person.  Having intimate contact with the genitals, mouth, or rectal areas of an infected person. SYMPTOMS  Some people may not have any symptoms, but   they can still pass the infection to others. Different STDs have different symptoms. Symptoms include:  Painful or bloody urination.  Pain in the pelvis, abdomen, vagina, anus, throat, or eyes.  Skin rash, itching, irritation, growths, or sores (lesions). These usually occur in the genital or anal area.  Abnormal vaginal discharge.  Penile discharge in men.  Soft, flesh-colored skin growths in the  genital or anal area.  Fever.  Pain or bleeding during sexual intercourse.  Swollen glands in the groin area.  Yellow skin and eyes (jaundice). This is seen with hepatitis. DIAGNOSIS  To make a diagnosis, your caregiver may:  Take a medical history.  Perform a physical exam.  Take a specimen (culture) to be examined.  Examine a sample of discharge under a microscope.  Perform blood test TREATMENT   Chlamydia, gonorrhea, trichomonas, and syphilis can be cured with antibiotic medicine.  Genital herpes, hepatitis, and HIV can be treated, but not cured, with prescribed medicines. The medicines will lessen the symptoms.  Genital warts from HPV can be treated with medicine or by freezing, burning (electrocautery), or surgery. Warts may come back.  HPV is a virus and cannot be cured with medicine or surgery.However, abnormal areas may be followed very closely by your caregiver and may be removed from the cervix, vagina, or vulva through office procedures or surgery. If your diagnosis is confirmed, your recent sexual partners need treatment. This is true even if they are symptom-free or have a negative culture or evaluation. They should not have sex until their caregiver says it is okay. HOME CARE INSTRUCTIONS  All sexual partners should be informed, tested, and treated for all STDs.  Take your antibiotics as directed. Finish them even if you start to feel better.  Only take over-the-counter or prescription medicines for pain, discomfort, or fever as directed by your caregiver.  Rest.  Eat a balanced diet and drink enough fluids to keep your urine clear or pale yellow.  Do not have sex until treatment is completed and you have followed up with your caregiver. STDs should be checked after treatment.  Keep all follow-up appointments, Pap tests, and blood tests as directed by your caregiver.  Only use latex condoms and water-soluble lubricants during sexual activity. Do not use  petroleum jelly or oils.  Avoid alcohol and illegal drugs.  Get vaccinated for HPV and hepatitis. If you have not received these vaccines in the past, talk to your caregiver about whether one or both might be right for you.  Avoid risky sex practices that can break the skin. The only way to avoid getting an STD is to avoid all sexual activity.Latex condoms and dental dams (for oral sex) will help lessen the risk of getting an STD, but will not completely eliminate the risk. SEEK MEDICAL CARE IF:   You have a fever.  You have any new or worsening symptoms. Document Released: 03/09/2003 Document Revised: 03/10/2012 Document Reviewed: 03/16/2011 [facility] Patient Information 2013 [employer].    Domestic Abuse You are being battered or abused if someone close to you hits, pushes, or physically hurts you in any way. You also are being abused if you are forced into activities. You are being sexually abused if you are forced to have sexual contact of any kind. You are being emotionally abused if you are made to feel worthless or if you are constantly threatened. It is important to remember that help is available. No one has the right to abuse you. PREVENTION OF FURTHER  ABUSE  Learn the warning signs of danger. This varies with situations but may include: the use of alcohol, threats, isolation from friends and family, or forced sexual contact. Leave if you feel that violence is going to occur.  If you are attacked or beaten, report it to the police so the abuse is documented. You do not have to press charges. The police can protect you while you or the attackers are leaving. Get the officer's name and badge number and a copy of the report.  Find someone you can trust and tell them what is happening to you: your caregiver, a nurse, clergy member, close friend or family member. Feeling ashamed is natural, but remember that you have done nothing wrong. No one deserves abuse. Document Released:  12/14/2000 Document Revised: 03/10/2012 Document Reviewed: 02/22/2011 ExitCare Patient Information 2013 [employer]Care[employer].    How Much is Too Much Alcohol? Drinking too much alcohol can cause injury, accidents, and health problems. These types of problems can include:   Car crashes.  Falls.  Family fighting (domestic violence).  Drowning.  Fights.  Injuries.  Burns.  Damage to certain organs.  Having a baby with birth defects. ONE DRINK CAN BE TOO MUCH WHEN YOU ARE:  Working.  Pregnant or breastfeeding.  Taking medicines. Ask your doctor.  Driving or planning to drive. If you or someone you know has a drinking problem, get help from a doctor.  Document Released: 10/13/2009 Document Revised: 03/10/2012 Document Reviewed: 10/13/2009 ExitCare Patient Information 2013 ExitCare[employer].   Smoking Hazards Smoking cigarettes is extremely bad for your health. Tobacco smoke has over 200 known poisons in it. There are over 60 chemicals in tobacco smoke that cause cancer. Some of the chemicals found in cigarette smoke include:   Cyanide.  Benzene.  Formaldehyde.  Methanol (wood alcohol).  Acetylene (fuel used in welding torches).  Ammonia. Cigarette smoke also contains the poisonous gases nitrogen oxide and carbon monoxide.  Cigarette smokers have an increased risk of many serious medical problems and Smoking causes approximately:  90% of all lung cancer deaths in men.  80% of all lung cancer deaths in women.  90% of deaths from chronic obstructive lung disease. Compared with nonsmokers, smoking increases the risk of:  Coronary heart disease by 2 to 4 times.  Stroke by 2 to 4 times.  Men developing lung cancer by 23 times.  Women developing lung cancer by 13 times.  Dying from chronic obstructive lung diseases by 12 times.  . Smoking is the most preventable cause of death and disease in our society.  WHY IS SMOKING ADDICTIVE?  Nicotine is the chemical  agent in tobacco that is capable of causing addiction or dependence.  When you smoke and inhale, nicotine is absorbed rapidly into the bloodstream through your lungs. Nicotine absorbed through the lungs is capable of creating a powerful addiction. Both inhaled and non-inhaled nicotine may be addictive.  Addiction studies of cigarettes and spit tobacco show that addiction to nicotine occurs mainly during the teen years, when young people begin using tobacco products. WHAT ARE THE BENEFITS OF QUITTING?  There are many health benefits to quitting smoking.   Likelihood of developing cancer and heart disease decreases. Health improvements are seen almost immediately.  Blood pressure, pulse rate, and breathing patterns start returning to normal soon after quitting. QUITTING SMOKING   American Lung Association - 1-800-LUNGUSA  American Cancer Society - 1-800-ACS-2345 Document Released: 01/24/2005 Document Revised: 03/10/2012 Document Reviewed: 09/28/2009 ExitCare Patient Information 2013 ExitCare,   LLC.   Stress Management Stress is a state of physical or mental tension that often results from changes in your life or normal routine. Some common causes of stress are:  Death of a loved one.  Injuries or severe illnesses.  Getting fired or changing jobs.  Moving into a new home. Other causes may be:  Sexual problems.  Business or financial losses.  Taking on a large debt.  Regular conflict with someone at home or at work.  Constant tiredness from lack of sleep. It is not just bad things that are stressful. It may be stressful to:  Win the lottery.  Get married.  Buy a new car. The amount of stress that can be easily tolerated varies from person to person. Changes generally cause stress, regardless of the types of change. Too much stress can affect your health. It may lead to physical or emotional problems. Too little stress (boredom) may also become stressful. SUGGESTIONS TO  REDUCE STRESS:  Talk things over with your family and friends. It often is helpful to share your concerns and worries. If you feel your problem is serious, you may want to get help from a professional counselor.  Consider your problems one at a time instead of lumping them all together. Trying to take care of everything at once may seem impossible. List all the things you need to do and then start with the most important one. Set a goal to accomplish 2 or 3 things each day. If you expect to do too many in a single day you will naturally fail, causing you to feel even more stressed.  Do not use alcohol or drugs to relieve stress. Although you may feel better for a short time, they do not remove the problems that caused the stress. They can also be habit forming.  Exercise regularly - at least 3 times per week. Physical exercise can help to relieve that "uptight" feeling and will relax you.  The shortest distance between despair and hope is often a good night's sleep.  Go to bed and get up on time allowing yourself time for appointments without being rushed.  Take a short "time-out" period from any stressful situation that occurs during the day. Close your eyes and take some deep breaths. Starting with the muscles in your face, tense them, hold it for a few seconds, then relax. Repeat this with the muscles in your neck, shoulders, hand, stomach, back and legs.  Take good care of yourself. Eat a balanced diet and get plenty of rest.  Schedule time for having fun. Take a break from your daily routine to relax. HOME CARE INSTRUCTIONS   Call if you feel overwhelmed by your problems and feel you can no longer manage them on your own.  Return immediately if you feel like hurting yourself or someone else. Document Released: 06/12/2001 Document Revised: 03/10/2012 Document Reviewed: 02/02/2008 ExitCare Patient Information 2013 ExitCare, LLC.  

## 2016-04-20 NOTE — Progress Notes (Signed)
Reviewed personally.  M. Suzanne Burnett Lieber, MD.  
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
## 2016-04-23 LAB — HEMOGLOBIN, FINGERSTICK: Hemoglobin, fingerstick: 12.3 g/dL (ref 12.0–16.0)

## 2016-05-18 ENCOUNTER — Other Ambulatory Visit: Payer: Self-pay | Admitting: Family Medicine

## 2016-05-18 NOTE — Telephone Encounter (Signed)
Is this okay to refill? 

## 2016-10-22 ENCOUNTER — Telehealth: Payer: Self-pay | Admitting: Nurse Practitioner

## 2016-10-22 NOTE — Telephone Encounter (Signed)
Spoke with patient. Patient has a Paragard IUD in place. IUD was placed on 05/11/2015. Reports since August 2017 she has been having 2 cycles per month. Reports cycles are heavier than normal. Changing a super tampon every hour and a half. Patient is not currently on menses. Reports menses just ended. Reports having moderate cramping with intermittent sharp pain with menses. Denies any pain at this time. Takes Aleve while on menses with relief. Denies any fatigue, light headed ness, or SOB. Advised she will need to be seen in the office for further evaluation. Patient is agreeable. Appointment scheduled for 10/26/2016 at 10:15 am with Ria CommentPatricia Grubb, FNP. Patient is agreeable to date and time.  Routing to provider for final review. Patient agreeable to disposition. Will close encounter.

## 2016-10-22 NOTE — Telephone Encounter (Signed)
Patient called and said, "I'd like to schedule an appointment with Patty. I have been having about two cycles a month for the last several months. I don't know if it's my IUD or what it could be."  Last seen: 04/20/16

## 2016-10-26 ENCOUNTER — Ambulatory Visit (INDEPENDENT_AMBULATORY_CARE_PROVIDER_SITE_OTHER): Payer: BLUE CROSS/BLUE SHIELD | Admitting: Nurse Practitioner

## 2016-10-26 ENCOUNTER — Encounter: Payer: Self-pay | Admitting: Nurse Practitioner

## 2016-10-26 VITALS — BP 116/74 | HR 64 | Ht 66.5 in | Wt 150.0 lb

## 2016-10-26 DIAGNOSIS — N926 Irregular menstruation, unspecified: Secondary | ICD-10-CM | POA: Diagnosis not present

## 2016-10-26 LAB — POCT URINE PREGNANCY: PREG TEST UR: NEGATIVE

## 2016-10-26 MED ORDER — IBUPROFEN 800 MG PO TABS: 800.0000 mg | ORAL_TABLET | Freq: Three times a day (TID) | ORAL | 1 refills | Status: DC | PRN

## 2016-10-26 NOTE — Patient Instructions (Signed)
Will call you with insurance confirmation and schedule of PUS

## 2016-10-26 NOTE — Progress Notes (Signed)
26 y.o. Married Caucasian female G0P0000 here for evaluation of irregular menses since August.  She has a Para guard IUD in place since 05/11/15.   Menses was heavier with IUD for 2 days, there were some cramps and migraine HA's.  Now since August menses is heavy for 3 days and a lot more cramps.  With now bleeding twice a month she is getting more HA's.  Both cycles gives her the bloating, breast tenderness, and heaviness of flow.  She does not think vaginal discharge has changed.  She has been married X 2 yrs and no concerns about STD's. Her cramps is the other big concern - takes OTC NSAID's with only some relief.  Denies pain with SA. 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  She is quite happy with the work environment.   O: Healthy WD,WN female Affect:  Normal no distress Skin: normal Abdomen:soft, non tender, normal bowel sounds Pelvic exam:EXTERNAL GENITALIA: normal appearing vulva with no masses, tenderness or lesions VAGINA: no abnormal discharge or lesions CERVIX: no lesions or cervical motion tenderness UTERUS: normal without mass ADNEXA: no masses palpable and non tender  A: Par guard IUD 05/11/2015  Irregular menses  Increase in dysmenorrhea  Increase in migraine HA's   P:  Discussed doing routine testing to make sure not an infection that could be cause of AUB.  Will also get a PUS to look at IUD and consult with Dr. Edward JollySilva  Will give her Motrin 800 mg TID prn.   Labs :  GC/ Chl, wet prep  Instructions given regarding:  We will get insurance verification and then call her for scheduling PUS - hopefully next Thursday am with Dr. Edward JollySilva  RV

## 2016-10-27 LAB — GC/CHLAMYDIA PROBE AMP
CT PROBE, AMP APTIMA: NOT DETECTED
GC PROBE AMP APTIMA: NOT DETECTED

## 2016-10-27 LAB — WET PREP BY MOLECULAR PROBE
CANDIDA SPECIES: NEGATIVE
Gardnerella vaginalis: NEGATIVE
Trichomonas vaginosis: NEGATIVE

## 2016-10-28 NOTE — Progress Notes (Signed)
Encounter reviewed by Dr. Panzy Bubeck Amundson C. Silva.  

## 2016-10-30 ENCOUNTER — Telehealth: Payer: Self-pay | Admitting: *Deleted

## 2016-10-30 NOTE — Telephone Encounter (Signed)
Left message to call regarding results -eh 

## 2016-10-30 NOTE — Telephone Encounter (Signed)
-----   Message from Ria CommentPatricia Grubb, FNP sent at 10/29/2016  7:50 AM EDT ----- Please let pt know that wet prep and GC/Chl were negative as expected and will proceed with PUS to evaluate the AUB from IUD.

## 2016-10-30 NOTE — Telephone Encounter (Signed)
Spoke with patient and gave results. U/S scheduled for next week- eh

## 2016-11-02 ENCOUNTER — Other Ambulatory Visit: Payer: Self-pay | Admitting: *Deleted

## 2016-11-02 DIAGNOSIS — N926 Irregular menstruation, unspecified: Secondary | ICD-10-CM

## 2016-11-08 ENCOUNTER — Ambulatory Visit (INDEPENDENT_AMBULATORY_CARE_PROVIDER_SITE_OTHER): Payer: BLUE CROSS/BLUE SHIELD

## 2016-11-08 ENCOUNTER — Encounter: Payer: Self-pay | Admitting: Obstetrics & Gynecology

## 2016-11-08 ENCOUNTER — Ambulatory Visit (INDEPENDENT_AMBULATORY_CARE_PROVIDER_SITE_OTHER): Payer: BLUE CROSS/BLUE SHIELD | Admitting: Obstetrics & Gynecology

## 2016-11-08 VITALS — BP 108/78 | HR 82 | Resp 14 | Ht 67.25 in | Wt 145.0 lb

## 2016-11-08 DIAGNOSIS — N83202 Unspecified ovarian cyst, left side: Secondary | ICD-10-CM | POA: Diagnosis not present

## 2016-11-08 DIAGNOSIS — N926 Irregular menstruation, unspecified: Secondary | ICD-10-CM | POA: Diagnosis not present

## 2016-11-08 DIAGNOSIS — N83201 Unspecified ovarian cyst, right side: Secondary | ICD-10-CM | POA: Diagnosis not present

## 2016-11-08 DIAGNOSIS — R102 Pelvic and perineal pain: Secondary | ICD-10-CM | POA: Diagnosis not present

## 2016-11-08 DIAGNOSIS — Z30431 Encounter for routine checking of intrauterine contraceptive device: Secondary | ICD-10-CM

## 2016-11-08 MED ORDER — TRAMADOL HCL 50 MG PO TABS: ORAL_TABLET | ORAL | 0 refills | Status: DC

## 2016-11-08 NOTE — Progress Notes (Signed)
26 y.o. 610P0000 Married Caucasian female here for pelvic ultrasound due to worsening pain that occurs around mid cycle and irregular bleeding that has been going on over the past three months, in particular.  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  She is not ready to consider pregnancy at this point.  She is using a ParaGard IUD for contraception.  Pt has hx of migraines with aura and even did not do well on POPs.  Had the ParaGard IUD placed 05/11/15.  Did notice cycles got a little longer and a little heavier after it was placed but feels headaches really improved so doesn't want to change methods unless absolutely necessary.  Patient's last menstrual period was 10/21/2016 (exact date).  Contraception: IUD  Findings:  UTERUS: 8.1 x 5.8 x. 4.5cm EMS: 6.185mm with IUD in correct location ADNEXA: Left ovary: 4.5 x 4.3 x 4.0cm with 4.4 x 4.3 x 4.5cm with thin walled 3.9 x 3.3cm with with retracted clot that is avascular as well.       Right ovary: 5.7 x 2.8 x 3.0cm with 3.8 x 3.3 x 3.2cm.  cyst that is thin walled with clot.  Cyst is avascular with smooth margins.  These findings on both ovaries are consistent with hemorrhagia cysts. CUL DE SAC: no free fluid  Discussion:  Findings reviewed with pt.  Due to retracted clot findings, these have likely been present over the last month or two.  I'm not sure this accounts for symptoms back until August.  Pt and I discussed other contraception options including Mirena or Skyla IUD.  Progesterone levels compared to oral method discussed.  Pt understands that she is likely going to continue having something like this with her current IUD.  Has been taking Motrin without success.  Will switch to Tramadol to see if this helps.  Instructions given.  Pt is also going to return for repeat PUS in 4-6 weeks to see if there is improvement.  Resolution of up to 12 weeks discussed.  Pt and I discussed possible laparoscopy if the cysts do not decrease in  size due to pain.  Also, other causes of pelvic pain reviewed including work-up and tests.  She would like to see if the tramadol will help and if findings are better with follow up PUS before proceeding with anything else and I am comfortable with this plan as well.  Assessment:  Bilateral ovarian cysts containing retracted clots consistent with hemorrhagic ovarian cysts Pelvic pain  DUB ParaGard IUD use  Plan:  Repeat PUS 4-6 weeks.  If no improvement, will consider laparoscopy Tramadol 50mg  1-2 tabs ever 4-6 hours as needed for pain.  #30/0RF  ~25 minutes spent with patient >50% of time was in face to face discussion of above.

## 2016-11-12 DIAGNOSIS — N83201 Unspecified ovarian cyst, right side: Secondary | ICD-10-CM | POA: Insufficient documentation

## 2016-11-12 DIAGNOSIS — Z30431 Encounter for routine checking of intrauterine contraceptive device: Secondary | ICD-10-CM | POA: Insufficient documentation

## 2016-11-12 DIAGNOSIS — N83202 Unspecified ovarian cyst, left side: Principal | ICD-10-CM

## 2016-12-06 ENCOUNTER — Ambulatory Visit (INDEPENDENT_AMBULATORY_CARE_PROVIDER_SITE_OTHER): Payer: BLUE CROSS/BLUE SHIELD | Admitting: Obstetrics & Gynecology

## 2016-12-06 ENCOUNTER — Ambulatory Visit (INDEPENDENT_AMBULATORY_CARE_PROVIDER_SITE_OTHER): Payer: BLUE CROSS/BLUE SHIELD

## 2016-12-06 VITALS — BP 130/80 | HR 80 | Resp 16 | Ht 67.25 in | Wt 151.0 lb

## 2016-12-06 DIAGNOSIS — Z30432 Encounter for removal of intrauterine contraceptive device: Secondary | ICD-10-CM | POA: Diagnosis not present

## 2016-12-06 DIAGNOSIS — G43119 Migraine with aura, intractable, without status migrainosus: Secondary | ICD-10-CM | POA: Diagnosis not present

## 2016-12-06 DIAGNOSIS — N83201 Unspecified ovarian cyst, right side: Secondary | ICD-10-CM | POA: Diagnosis not present

## 2016-12-06 DIAGNOSIS — N83202 Unspecified ovarian cyst, left side: Secondary | ICD-10-CM

## 2016-12-06 DIAGNOSIS — R102 Pelvic and perineal pain: Secondary | ICD-10-CM | POA: Diagnosis not present

## 2016-12-06 NOTE — Progress Notes (Signed)
26 y.o. 820P0000 Married Caucasian female here for pelvic ultrasound due to recheck bilateral ovarian cysts.  These appeared to be hemorrhagic at last PUS.  Pt reports she is still having the same pain.  At times, she will just have this very severe cramp that just makes her stop.  It will go away but it unpredictable and very bothersome.  She would just like it to go away.  It low and midline.  Does not have constipation or diarrhea.  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  Pt also reports that she cannot seem to shake the current headache she has.  Long hx of migraines.  Is not on any maintenance medication.  This headache has been on-going for about a month.  It does not keep her from working.  Does have worsening episodes for which she will take a Relpax and this helps but does not make it go completely away.  She does not feel she needs to go to urgent care at this time but she does want some help with this.  Has been years since she saw anyone specifically about her migraines.    Patient's last menstrual period was 11/14/2016 (approximate).  Contraception: ParaGard IUD  Findings:  UTERUS: 8.5 x 4.7 x 4.3cm, IUD does appear in correct positioning EMS: 4.673mm ADNEXA: Left ovary: 3.5 x 2.1 x 1.7cm with collapsing corpus luteum measuring 1.2 x 1.5cm       Right ovary: 2.9 x 12.6 x 1.6cm.  Previously cysts have resolved. CUL DE SAC: no free fluid  Discussion:  Findings reviewed.  At ovaries are now without the hemorrhagic cysts, feel only other source of pain could be her IUD.  She has never been pregnant so may be that this IUD is just too large for her.  She is willing to try just about anything to get the pelvic pain to resolve.  Agrees with IUD removal today.  If this does not help, then we have agreed to proceed with laparoscopy  Physical Exam  Constitutional: She appears well-developed and well-nourished.  Genitourinary: Vagina normal. There is no  rash, tenderness, lesion or injury on the right labia. There is no rash, tenderness, lesion or injury on the left labia. Right adnexum displays no tenderness. No erythema, tenderness or bleeding in the vagina. No foreign body in the vagina. No signs of injury around the vagina. No vaginal discharge found.  Genitourinary Comments: Speculum placed.  IUD string noted and IUD removed with one pull.  Pt tolerated procedure well.    Lymphadenopathy:       Right: No inguinal adenopathy present.       Left: No inguinal adenopathy present.    Assessment:  Pelvic pain Hemorrhagic ovarian cysts that have resolved IUD removal today Migraine headache, persistent  Plan:  Pt is going to give update in a week or two.  If pain is not resolved, will plan laparoscopy and consider replacement of ParaGard IUD.  She is aware she needs to use condoms for contraception.  Feels very comfortable with this. Referral will be made to neurology due to persistent migraine.  ~20 minutes spent with patient >50% of time was in face to face discussion of above.

## 2016-12-08 ENCOUNTER — Encounter: Payer: Self-pay | Admitting: Obstetrics & Gynecology

## 2016-12-10 ENCOUNTER — Telehealth: Payer: Self-pay | Admitting: Obstetrics & Gynecology

## 2016-12-10 DIAGNOSIS — G43119 Migraine with aura, intractable, without status migrainosus: Secondary | ICD-10-CM

## 2016-12-10 NOTE — Telephone Encounter (Signed)
Patient called and states that the Neurologist we referred her to can't get her in until 02/14/17.  She wants to know if there is anything we can do to help push this referral along and get her a sooner appointment.

## 2016-12-11 NOTE — Telephone Encounter (Signed)
Patient was referred to Dr.Aquino with [facility]. Per patient and referral notes first available appointment is in February 2018.   Dr.Miller, okay to place [facility] to see if they can get the patient in earlier?

## 2016-12-12 NOTE — Telephone Encounter (Signed)
Yes.  Thank you.  OK to refer to [facility].

## 2016-12-13 NOTE — Telephone Encounter (Signed)
Referral has been placed to [facility] for patient to see Dr.Willis or first available provider. Patient has been notified and is agreeable. Aware our referrals coordinator in the office will work to coordinate appointment. Aware she will be notified directly by [facility] or our office regarding appointment date and time.  Cc: Braxton Feathersebecca Frahm for referral coordination  Routing to provider for final review. Patient agreeable to disposition. Will close encounter.

## 2017-01-03 ENCOUNTER — Other Ambulatory Visit: Payer: Self-pay | Admitting: Family Medicine

## 2017-01-04 ENCOUNTER — Ambulatory Visit (INDEPENDENT_AMBULATORY_CARE_PROVIDER_SITE_OTHER): Payer: BLUE CROSS/BLUE SHIELD | Admitting: Family Medicine

## 2017-01-04 ENCOUNTER — Encounter: Payer: Self-pay | Admitting: Family Medicine

## 2017-01-04 VITALS — BP 120/72 | HR 64 | Temp 98.3°F | Wt 154.2 lb

## 2017-01-04 DIAGNOSIS — R112 Nausea with vomiting, unspecified: Secondary | ICD-10-CM

## 2017-01-04 DIAGNOSIS — G43111 Migraine with aura, intractable, with status migrainosus: Secondary | ICD-10-CM | POA: Diagnosis not present

## 2017-01-04 MED ORDER — METHYLPREDNISOLONE 4 MG PO TBPK: ORAL_TABLET | ORAL | 0 refills | Status: DC

## 2017-01-04 MED ORDER — ONDANSETRON 4 MG PO TBDP: 4.0000 mg | ORAL_TABLET | Freq: Three times a day (TID) | ORAL | 0 refills | Status: DC | PRN

## 2017-01-04 MED ORDER — PROMETHAZINE HCL 25 MG/ML IJ SOLN
25.0000 mg | Freq: Once | INTRAMUSCULAR | Status: AC
  Administered 2017-01-04: 25 mg via INTRAMUSCULAR

## 2017-01-04 MED ORDER — PROMETHAZINE HCL 25 MG PO TABS: 25.0000 mg | ORAL_TABLET | Freq: Once | ORAL | Status: DC

## 2017-01-04 MED ORDER — KETOROLAC TROMETHAMINE 60 MG/2ML IM SOLN
60.0000 mg | Freq: Once | INTRAMUSCULAR | Status: AC
  Administered 2017-01-04: 60 mg via INTRAMUSCULAR

## 2017-01-04 NOTE — Telephone Encounter (Signed)
Is this okay to refill? 

## 2017-01-04 NOTE — Patient Instructions (Signed)
You received injections today including Toradol 60mg  and Promethazine 25mg .  Do not take any additional NSAIDs or anti-inflammatories for the next 24 hours.  Stay well hydrated.  Follow up with the neurologist next week as scheduled.    Migraine Headache A migraine headache is an intense, throbbing pain on one side or both sides of the head. Migraines may also cause other symptoms, such as nausea, vomiting, and sensitivity to light and noise. What are the causes? Doing or taking certain things may also trigger migraines, such as:  Alcohol.  Smoking.  Medicines, such as:  Medicine used to treat chest pain (nitroglycerine).  Birth control pills.  Estrogen pills.  Certain blood pressure medicines.  Aged cheeses, chocolate, or caffeine.  Foods or drinks that contain nitrates, glutamate, aspartame, or tyramine.  Physical activity. Other things that may trigger a migraine include:  Menstruation.  Pregnancy.  Hunger.  Stress, lack of sleep, too much sleep, or fatigue.  Weather changes. What increases the risk? The following factors may make you more likely to experience migraine headaches:  Age. Risk increases with age.  Family history of migraine headaches.  Being Caucasian.  Depression and anxiety.  Obesity.  Being a woman.  Having a hole in the heart (patent foramen ovale) or other heart problems. What are the signs or symptoms? The main symptom of this condition is pulsating or throbbing pain. Pain may:  Happen in any area of the head, such as on one side or both sides.  Interfere with daily activities.  Get worse with physical activity.  Get worse with exposure to bright lights or loud noises. Other symptoms may include:  Nausea.  Vomiting.  Dizziness.  General sensitivity to bright lights, loud noises, or smells. Before you get a migraine, you may get warning signs that a migraine is developing (aura). An aura may include:  Seeing flashing  lights or having blind spots.  Seeing bright spots, halos, or zigzag lines.  Having tunnel vision or blurred vision.  Having numbness or a tingling feeling.  Having trouble talking.  Having muscle weakness. How is this diagnosed? A migraine headache can be diagnosed based on:  Your symptoms.  A physical exam.  Tests, such as CT scan or MRI of the head. These imaging tests can help rule out other causes of headaches.  Taking fluid from the spine (lumbar puncture) and analyzing it (cerebrospinal fluid analysis, or CSF analysis). How is this treated? A migraine headache is usually treated with medicines that:  Relieve pain.  Relieve nausea.  Prevent migraines from coming back. Treatment may also include:  Acupuncture.  Lifestyle changes like avoiding foods that trigger migraines. Follow these instructions at home: Medicines  Take over-the-counter and prescription medicines only as told by your health care provider.  Do not drive or use heavy machinery while taking prescription pain medicine.  To prevent or treat constipation while you are taking prescription pain medicine, your health care provider may recommend that you:  Drink enough fluid to keep your urine clear or pale yellow.  Take over-the-counter or prescription medicines.  Eat foods that are high in fiber, such as fresh fruits and vegetables, whole grains, and beans.  Limit foods that are high in fat and processed sugars, such as fried and sweet foods. Lifestyle  Avoid alcohol use.  Do not use any products that contain nicotine or tobacco, such as cigarettes and e-cigarettes. If you need help quitting, ask your health care provider.  Get at least 8 hours of  sleep every night.  Limit your stress. General instructions  Keep a journal to find out what may trigger your migraine headaches. For example, write down:  What you eat and drink.  How much sleep you get.  Any change to your diet or  medicines.  If you have a migraine:  Avoid things that make your symptoms worse, such as bright lights.  It may help to lie down in a dark, quiet room.  Do not drive or use heavy machinery.  Ask your health care provider what activities are safe for you while you are experiencing symptoms.  Keep all follow-up visits as told by your health care provider. This is important. Contact a health care provider if:  You develop symptoms that are different or more severe than your usual migraine symptoms. Get help right away if:  Your migraine becomes severe.  You have a fever.  You have a stiff neck.  You have vision loss.  Your muscles feel weak or like you cannot control them.  You start to lose your balance often.  You develop trouble walking.  You faint. This information is not intended to replace advice given to you by your health care provider. Make sure you discuss any questions you have with your health care provider. Document Released: 12/17/2005 Document Revised: 07/06/2016 Document Reviewed: 06/04/2016 Elsevier Interactive Patient Education  2017 [employer].

## 2017-01-04 NOTE — Progress Notes (Signed)
Subjective:    Patient ID: Marilyn Hunter, female    DOB: [birth year], 27 y.o.   MRN: [id number]  HPI Chief Complaint  Patient presents with  . bad migraine    constant migraine since august. some days throwing up and. got a neurologist appt next week for headaches   She is here with her husband for complaints of a "migraine". She has a long history of migraines with aura. Has seen multiple providers for migraine in the past but has not seen a neurologist. States she discussed her migraine with her OB/GYN and she referred her to Dr. Anne Hahn and has an appointment next week. States she has never taken preventive medication and is not open to doing this.   States her current headache has been lingering since August and varying in intensity. States yesterday her headache was 10/10 and today reports an 8/10. States she took Frova yesterday and it did not help at all.  Describes her pain as a dull ache that is pulsating to the right frontal/temporal region. Also complains of photosensitivity, phonophobia, and nausea. No numbness, tingling, weakness, vision loss. No recent URI, sinus or allergy symptoms. Not sure of triggers. Has used Excedrin, Aleve and Frova this week. No other aggravating or alleviating factors.   IUD removed 3 weeks ago. Using condoms now.   Past Medical History:  Diagnosis Date  . Abnormal Pap smear 2006   ASCUS, pos HR HPV  . Allergic rhinitis, cause unspecified   . Migraines    chronic   Past Surgical History:  Procedure Laterality Date  . FOOT SURGERY  L 01/2012; R 02/2012   bilateral taylor bunionectomies, and similar to regular bunionectomy surgeries (1st and 5th metatarsals bilaterally)  . TONSILLECTOMY  age 69    Reviewed allergies, medications, past medical, surgical, and social history.   Review of Systems Pertinent positives and negatives in the history of present illness.     Objective:   Physical Exam  Constitutional: She is oriented to person,  place, and time. She appears well-developed and well-nourished. She appears ill. No distress.  HENT:  Right Ear: Tympanic membrane and ear canal normal.  Left Ear: Tympanic membrane and ear canal normal.  Nose: Nose normal. Right sinus exhibits no maxillary sinus tenderness and no frontal sinus tenderness. Left sinus exhibits no maxillary sinus tenderness and no frontal sinus tenderness.  Mouth/Throat: Uvula is midline, oropharynx is clear and moist and mucous membranes are normal.  Eyes:  Benign fundi  Neck: Full passive range of motion without pain. Neck supple.  Cardiovascular: Normal rate, regular rhythm, normal heart sounds and normal pulses.   Pulmonary/Chest: Effort normal and breath sounds normal.  Lymphadenopathy:    She has no cervical adenopathy.  Neurological: She is alert and oriented to person, place, and time. She has normal strength and normal reflexes. No cranial nerve deficit or sensory deficit. She displays a negative Romberg sign. Coordination and gait normal.  Skin: Skin is warm and dry. No rash noted. No pallor.  Psychiatric: She has a normal mood and affect. Her speech is normal and behavior is normal. Judgment and thought content normal. Cognition and memory are normal.   BP 120/72   Pulse 64   Temp 98.3 F (36.8 C) (Oral)   Wt 154 lb 3.2 oz (69.9 kg)   LMP 11/14/2016 (Approximate)   BMI 23.97 kg/m       Assessment & Plan:  Intractable migraine with aura with status migrainosus - Plan: ketorolac (TORADOL) injection  60 mg, promethazine (PHENERGAN) injection 25 mg, DISCONTINUED: promethazine (PHENERGAN) tablet 25 mg  Non-intractable vomiting with nausea, unspecified vomiting type - Plan: promethazine (PHENERGAN) injection 25 mg, DISCONTINUED: promethazine (PHENERGAN) tablet 25 mg  Discussed symptoms and options for abortive migraine treatment. She has not had success with NSAIDs or Triptans. Her husband is driving and she will go home to rest. Intramuscular  injections given of Toradol 60 mg and Promethazine 25 mg in office. If she is not significantly improved by tomorrow, she will start Medrol dose pack. Zofran ODT sent to pharmacy as well. She has an appt with Dr. Anne HahnWillis, neurologist, next week and will follow up as scheduled.   Spent a minimum of 25 minutes with patient and at least 50% was in counseling and coordination of care. Moderate medical decision making required.

## 2017-01-09 ENCOUNTER — Ambulatory Visit (INDEPENDENT_AMBULATORY_CARE_PROVIDER_SITE_OTHER): Payer: BLUE CROSS/BLUE SHIELD | Admitting: Neurology

## 2017-01-09 ENCOUNTER — Encounter: Payer: Self-pay | Admitting: Neurology

## 2017-01-09 VITALS — BP 128/71 | HR 89 | Ht 67.0 in | Wt 154.5 lb

## 2017-01-09 DIAGNOSIS — G43829 Menstrual migraine, not intractable, without status migrainosus: Secondary | ICD-10-CM | POA: Diagnosis not present

## 2017-01-09 MED ORDER — TOPIRAMATE 25 MG PO TABS: ORAL_TABLET | ORAL | 3 refills | Status: DC

## 2017-01-09 MED ORDER — KETOROLAC TROMETHAMINE 10 MG PO TABS: 10.0000 mg | ORAL_TABLET | Freq: Three times a day (TID) | ORAL | 2 refills | Status: DC | PRN

## 2017-01-09 MED ORDER — FOLIC ACID 1 MG PO TABS: 1.0000 mg | ORAL_TABLET | Freq: Every day | ORAL | 1 refills | Status: DC

## 2017-01-09 NOTE — Patient Instructions (Signed)
   Topamax (topiramate) is a seizure medication that has an FDA approval for seizures and for migraine headache. Potential side effects of this medication include weight loss, cognitive slowing, tingling in the fingers and toes, and carbonated drinks will taste bad. If any significant side effects are noted on this drug, please contact our office.  

## 2017-01-09 NOTE — Progress Notes (Signed)
Reason for visit: Intractable migraine  Referring physician: Dr. Minerva Fester is a 27 y.o. female  History of present illness:  Ms. Mutz is a 27 year old right-handed white female with a history of migraine headaches since age 34. The headaches usually occur about 2 or 3 times a month, the patient has taken rescue drugs such as Imitrex or Frova for the headache previously. Beginning in August 2017, the patient had converted migraine with the headaches becoming daily in nature. The patient has essentially not had any days without headache since that time. Two or three times a week the headache may be more severe, and may be associated with nausea or vomiting. The patient usually does not miss work because of the headache, however. The patient may have some headache primarily around the right eye, associated with scalp tenderness. The headache may project back on the right side of the head into the right neck area with some neck stiffness. She may have some spots in front on the right. She denies any significant confusion or cognitive slowing with the headache. The patient denies any numbness or weakness of the face, arms, or legs. Certain exposures such as cheese or almonds or alcohol may worsen the headache. Cigarette smoke or strong perfumes may also bring on headaches. The patient does experience photophobia and phonophobia with the headache. She has a maternal aunt and a maternal grandmother with migraine, her mother did not have headache. She is sent to this office for further evaluation. In the past, she has resisted taking daily medications for the headache.  Past Medical History:  Diagnosis Date  . Abnormal Pap smear 2006   ASCUS, pos HR HPV  . Allergic rhinitis, cause unspecified   . Migraines    chronic  . Ovarian cyst     Past Surgical History:  Procedure Laterality Date  . FOOT SURGERY  L 01/2012; R 02/2012   bilateral taylor bunionectomies, and similar to regular  bunionectomy surgeries (1st and 5th metatarsals bilaterally)  . TONSILLECTOMY  age 66    Family History  Problem Relation Age of Onset  . Hodgkin's lymphoma Mother 66  . Thyroid cancer Mother     (from radiation/hodgkin's treatment)  . Breast cancer Mother 56    possibly related to radiation treatment for hodgkins  BRCA test is negative  . Cancer Mother     hodgkin's; thyroid; breast; now new tumor in thoracic spine from possible breast cancer.  . Cervical cancer Maternal Aunt 53    stage III - clinical trial - BRCA negative  . Hyperlipidemia Maternal Grandfather   . Diabetes Neg Hx   . Heart disease Neg Hx     Social history:  reports that she has never smoked. She has never used smokeless tobacco. She reports that she drinks about 0.6 - 1.2 oz of alcohol per week . She reports that she does not use drugs.  Medications:  Prior to Admission medications   Medication Sig Start Date End Date Taking? Authorizing Provider  frovatriptan (FROVA) 2.5 MG tablet TAKE 1 TABLET IF NEEDED, IF RECURS MAY REPEAT AFTER 2HRS *MAX 3 TABS/24 HOURS* 01/04/17  Yes Denita Lung, MD  methylPREDNISolone (MEDROL DOSEPAK) 4 MG TBPK tablet Follow package instructions. 01/04/17  Yes Girtha Rm, NP  Multiple Vitamins-Minerals (MULTI-VITAMIN GUMMIES PO) Take 1 each by mouth daily.   Yes Historical Provider, MD  naproxen sodium (ANAPROX) 220 MG tablet Take 220 mg by mouth 2 (two) times daily with a  meal.   Yes Historical Provider, MD  ondansetron (ZOFRAN ODT) 4 MG disintegrating tablet Take 1 tablet (4 mg total) by mouth every 8 (eight) hours as needed for nausea or vomiting. 01/04/17  Yes Girtha Rm, NP  folic acid (FOLVITE) 1 MG tablet Take 1 tablet (1 mg total) by mouth daily. 01/09/17   Kathrynn Ducking, MD  ketorolac (TORADOL) 10 MG tablet Take 1 tablet (10 mg total) by mouth every 8 (eight) hours as needed. 01/09/17   Kathrynn Ducking, MD  topiramate (TOPAMAX) 25 MG tablet Take one tablet at night for one  week, then take 2 tablets at night for one week, then take 3 tablets at night. 01/09/17   Kathrynn Ducking, MD     No Known Allergies  ROS:  Out of a complete 14 system review of symptoms, the patient complains only of the following symptoms, and all other reviewed systems are negative.  Fatigue Ringing in the ears, spending sensations Blurred vision Headache, dizziness Not enough sleep, decreased energy Insomnia  Blood pressure 128/71, pulse 89, height 5' 7" (1.702 m), weight 154 lb 8 oz (70.1 kg).  Physical Exam  General: The patient is alert and cooperative at the time of the examination.  Eyes: Pupils are equal, round, and reactive to light. Discs are flat bilaterally.  Neck: The neck is supple, no carotid bruits are noted.  Respiratory: The respiratory examination is clear.  Cardiovascular: The cardiovascular examination reveals a regular rate and rhythm, no obvious murmurs or rubs are noted.   Neuromuscular: Range of movement of the cervical spine is full. No crepitus is noted in the temporomandibular joints.  Skin: Extremities are without significant edema.  Neurologic Exam  Mental status: The patient is alert and oriented x 3 at the time of the examination. The patient has apparent normal recent and remote memory, with an apparently normal attention span and concentration ability.  Cranial nerves: Facial symmetry is present. There is good sensation of the face to pinprick and soft touch bilaterally. The strength of the facial muscles and the muscles to head turning and shoulder shrug are normal bilaterally. Speech is well enunciated, no aphasia or dysarthria is noted. Extraocular movements are full. Visual fields are full. The tongue is midline, and the patient has symmetric elevation of the soft palate. No obvious hearing deficits are noted.  Motor: The motor testing reveals 5 over 5 strength of all 4 extremities. Good symmetric motor tone is noted  throughout.  Sensory: Sensory testing is intact to pinprick, soft touch, vibration sensation, and position sense on all 4 extremities. No evidence of extinction is noted.  Coordination: Cerebellar testing reveals good finger-nose-finger and heel-to-shin bilaterally.  Gait and station: Gait is normal. Tandem gait is normal. Romberg is negative. No drift is seen.  Reflexes: Deep tendon reflexes are symmetric and normal bilaterally. Toes are downgoing bilaterally.   Assessment/Plan:  1. Intractable migraine headache  The patient has had converted migraine beginning in August 2017. The patient is having significant problems with the headache at this time, she is willing to go on a daily medication for the headache. She has Frova to take if needed, a prescription will be given for Toradol tablets. The patient will be placed on Topamax. If the headaches do not abate, MRI of the brain will be done in the future. The patient indicates that she drinks 2 or 3 cups of coffee daily. If she stops drinking coffee, this will exacerbate her headache. The patient  was also given a prescription for folic acid. The patient is not on birth control pills, she just recently had her IUD removed secondary to polycystic ovaries. In the past, the patient has had menstrual migraine. Birth control pills previously have worsened her headache. The patient will follow-up in 3 months.  Jill Alexanders MD 01/09/2017 9:59 AM  [facility] [address] [place], [place] [address]  Phone [phone] Fax [phone]

## 2017-01-28 ENCOUNTER — Telehealth: Payer: Self-pay | Admitting: Neurology

## 2017-01-28 NOTE — Telephone Encounter (Signed)
Patient is calling in reference to topamax.  Patient has been having a lot of tingling and numbness in both hands, tired, unable to concentrate.  Migraines are still here had a bad one over the weekend.  Please call

## 2017-01-28 NOTE — Telephone Encounter (Signed)
I called the patient. The patient is having tingling in the hands on the Topamax, some cognitive side effects. She is just now ramping up on the dose of the medication, too early to tell whether this will be helpful for the headache. She has not yet gotten to 75 mg at night. She is to continue ramping up on the dose. If she truly is not getting benefit, we can try another medication.  The tingling in hands generally will get better the longer she is on the medication.

## 2017-02-06 ENCOUNTER — Ambulatory Visit (INDEPENDENT_AMBULATORY_CARE_PROVIDER_SITE_OTHER): Payer: BLUE CROSS/BLUE SHIELD | Admitting: Family Medicine

## 2017-02-06 VITALS — BP 110/70 | HR 100 | Temp 97.8°F | Resp 14 | Wt 150.0 lb

## 2017-02-06 DIAGNOSIS — J111 Influenza due to unidentified influenza virus with other respiratory manifestations: Secondary | ICD-10-CM | POA: Diagnosis not present

## 2017-02-06 DIAGNOSIS — R509 Fever, unspecified: Secondary | ICD-10-CM | POA: Diagnosis not present

## 2017-02-06 LAB — POC INFLUENZA A&B (BINAX/QUICKVUE)
Influenza A, POC: NEGATIVE
Influenza B, POC: NEGATIVE

## 2017-02-06 NOTE — Progress Notes (Signed)
   Subjective:    Patient ID: Marilyn Hunter, female    DOB: 30-Jan-1990, 27 y.o.   MRN: [id number]  HPI Approximately 4 days ago she developed malaise followed the next day by fever, myalgias, cough, congestion, malaise, slight sore throat with myalgias. The symptoms have continued. She was exposed to the flu by her husband.   Review of Systems     Objective:   Physical Exam Alert and in no distress. Tympanic membranes and canals are normal. Pharyngeal area is normal. Neck is supple without adenopathy or thyromegaly. Cardiac exam shows a regular sinus rhythm without murmurs or gallops. Lungs are clear to auscultation. Flu test negative       Assessment & Plan:  Influenza In spite of the negative tests, clinically she has the flu. Recommend conservative care with fluids, pain relief as well as treatment of fever. She is to stay out of work until his fever has gone for at least 24 hours. She will call if symptoms worsen.

## 2017-04-03 ENCOUNTER — Ambulatory Visit (INDEPENDENT_AMBULATORY_CARE_PROVIDER_SITE_OTHER): Payer: BLUE CROSS/BLUE SHIELD | Admitting: Neurology

## 2017-04-03 ENCOUNTER — Encounter: Payer: Self-pay | Admitting: Neurology

## 2017-04-03 ENCOUNTER — Encounter (INDEPENDENT_AMBULATORY_CARE_PROVIDER_SITE_OTHER): Payer: Self-pay

## 2017-04-03 DIAGNOSIS — G43019 Migraine without aura, intractable, without status migrainosus: Secondary | ICD-10-CM

## 2017-04-03 HISTORY — DX: Migraine without aura, intractable, without status migrainosus: G43.019

## 2017-04-03 MED ORDER — NORTRIPTYLINE HCL 10 MG PO CAPS: ORAL_CAPSULE | ORAL | 3 refills | Status: DC

## 2017-04-03 NOTE — Patient Instructions (Signed)
We will start nortriptyline 10 mg at night, work up to 30 mg over  serveral weeks, will go down on the Topamax by one tablet every week until off.  Pamelor (nortriptyline) is an antidepressant medication that has many uses that may include headache, whiplash injuries, or for peripheral neuropathy pain. Side effects may include drowsiness, dry mouth, blurred vision, or constipation. As with any antidepressant medication, worsening depression may occur. If you had any significant side effects, please call our office. The full effects of this medication may take 7-10 days after starting the drug, or going up on the dose.

## 2017-04-03 NOTE — Progress Notes (Signed)
Reason for visit: Headache  Marilyn Hunter is an 27 y.o. female  History of present illness:  Marilyn Hunter is a 27 year old right-handed white female with a history of daily headaches. The patient works as an Forensic psychologist, she does not miss work because of the headaches but she does have episodes of nausea and vomiting associated with the headache when it gets severe. The patient has been on Topamax, the headaches remain daily and she believes that she is having some side effects on the drug. She reports frequent episodes of right arm numbness and discomfort and muscle cramps in the evening hours, she is not sleeping well in part because of this. The fingers of the hand may cramp in a flexed position. The patient denies any significant neck pain, she does have some pressure sensation in the neck at times. Most of her headaches are bifrontal in nature. She returns for an evaluation.  Past Medical History:  Diagnosis Date  . Abnormal Pap smear 2006   ASCUS, pos HR HPV  . Allergic rhinitis, cause unspecified   . Migraines    chronic  . Ovarian cyst     Past Surgical History:  Procedure Laterality Date  . FOOT SURGERY  L 01/2012; R 02/2012   bilateral taylor bunionectomies, and similar to regular bunionectomy surgeries (1st and 5th metatarsals bilaterally)  . TONSILLECTOMY  age 17    Family History  Problem Relation Age of Onset  . Hodgkin's lymphoma Mother 10  . Thyroid cancer Mother     (from radiation/hodgkin's treatment)  . Breast cancer Mother 46    possibly related to radiation treatment for hodgkins  BRCA test is negative  . Cancer Mother     hodgkin's; thyroid; breast; now new tumor in thoracic spine from possible breast cancer.  . Cervical cancer Maternal Aunt 53    stage III - clinical trial - BRCA negative  . Hyperlipidemia Maternal Grandfather   . Diabetes Neg Hx   . Heart disease Neg Hx     Social history:  reports that she has never smoked. She has never used  smokeless tobacco. She reports that she drinks about 0.6 - 1.2 oz of alcohol per week . She reports that she does not use drugs.   No Known Allergies  Medications:  Prior to Admission medications   Medication Sig Start Date End Date Taking? Authorizing Provider  folic acid (FOLVITE) 1 MG tablet Take 1 tablet (1 mg total) by mouth daily. 01/09/17  Yes Kathrynn Ducking, MD  frovatriptan (FROVA) 2.5 MG tablet TAKE 1 TABLET IF NEEDED, IF RECURS MAY REPEAT AFTER 2HRS *MAX 3 TABS/24 HOURS* 01/04/17  Yes Denita Lung, MD  Multiple Vitamins-Minerals (MULTI-VITAMIN GUMMIES PO) Take 1 each by mouth daily.   Yes Historical Provider, MD  naproxen sodium (ANAPROX) 220 MG tablet Take 220 mg by mouth 2 (two) times daily with a meal.   Yes Historical Provider, MD  ondansetron (ZOFRAN ODT) 4 MG disintegrating tablet Take 1 tablet (4 mg total) by mouth every 8 (eight) hours as needed for nausea or vomiting. 01/04/17  Yes Vickie L Henson, NP-C  topiramate (TOPAMAX) 25 MG tablet Take one tablet at night for one week, then take 2 tablets at night for one week, then take 3 tablets at night. 01/09/17  Yes Kathrynn Ducking, MD    ROS:  Out of a complete 14 system review of symptoms, the patient complains only of the following symptoms, and all other reviewed systems  are negative.  Fatigue Light sensitivity, double vision, blurred vision Nausea, vomiting Insomnia, frequent waking Muscle cramps Dizziness, headache, numbness Depression  Blood pressure 118/67, pulse 83, resp. rate 20, height '5\' 7"'  (1.702 m), weight 147 lb (66.7 kg).  Physical Exam  General: The patient is alert and cooperative at the time of the examination.  Neuromuscular: Range of movement of the cervical spine is full.  Skin: No significant peripheral edema is noted.   Neurologic Exam  Mental status: The patient is alert and oriented x 3 at the time of the examination. The patient has apparent normal recent and remote memory, with an  apparently normal attention span and concentration ability.   Cranial nerves: Facial symmetry is present. Speech is normal, no aphasia or dysarthria is noted. Extraocular movements are full. Visual fields are full.  Motor: The patient has good strength in all 4 extremities.  Sensory examination: Soft touch sensation is symmetric on the face, arms, and legs.  Coordination: The patient has good finger-nose-finger and heel-to-shin bilaterally. Tinel's sign at the wrists are mildly positive bilaterally.  Gait and station: The patient has a normal gait. Tandem gait is normal. Romberg is negative. No drift is seen.  Reflexes: Deep tendon reflexes are symmetric.   Assessment/Plan:  1. Intractable migraine headache  2. Right arm numbness and discomfort  I am not clear that the right arm symptoms are related to Topamax, the patient could have another issue such as carpal tunnel syndrome. The symptoms are bothersome to the patient, and are worse at night. She is not sleeping well. The Topamax has not helped the headache. We will initiate low-dose nortriptyline and taper off of the Topamax. The patient may stop folic acid as she goes on to the nortriptyline. She will follow-up in 3 months. She is to call for any dose adjustments of the medication.  Jill Alexanders MD 04/03/2017 7:42 AM  [facility] [address] [place], [place] [address]  Phone [phone] Fax [phone]

## 2017-04-26 ENCOUNTER — Encounter: Payer: Self-pay | Admitting: Nurse Practitioner

## 2017-04-26 ENCOUNTER — Ambulatory Visit (INDEPENDENT_AMBULATORY_CARE_PROVIDER_SITE_OTHER): Payer: BLUE CROSS/BLUE SHIELD | Admitting: Nurse Practitioner

## 2017-04-26 VITALS — BP 100/66 | HR 56 | Ht 66.0 in | Wt 145.0 lb

## 2017-04-26 DIAGNOSIS — Z0000 Encounter for general adult medical examination without abnormal findings: Secondary | ICD-10-CM

## 2017-04-26 DIAGNOSIS — R102 Pelvic and perineal pain: Secondary | ICD-10-CM | POA: Diagnosis not present

## 2017-04-26 DIAGNOSIS — Z01419 Encounter for gynecological examination (general) (routine) without abnormal findings: Secondary | ICD-10-CM | POA: Diagnosis not present

## 2017-04-26 NOTE — Patient Instructions (Signed)

## 2017-04-26 NOTE — Progress Notes (Signed)
Reviewed personally.  M. Suzanne Jovana Rembold, MD.  

## 2017-04-26 NOTE — Progress Notes (Signed)
27 y.o. G0P0000 Married  Caucasian Fe here for annual exam.    Last  year menses was irregular with OV cyst with spotting that was frequent and  abnormal.  She then had PUS 11/17 and 12/17 showing bilateral OV cyst.  The ParaGard IUD was removed 12/06/16.  Since then still having this chronic pelvic pain that is about 10-14 days per months.  Some pain prior to menses and during menses. Regular menses would last 4-5 days and was heavy.  Lower pelvic pain with radiation through to back with scale 7-8/ 10.  Similar pain when cyst was present.   Currently condoms each time. No dyspareunia.  Patient's last menstrual period was 04/08/2017 (exact date).          Sexually active: Yes.    The current method of family planning is condoms most of the time. Exercising: Yes.    yoga when has the time Smoker:  no  Health Maintenance: Pap: 04/18/15, Negative with neg HR HPV  10/08/12, Negative History of Abnormal Pap: yes, history of ASCUS with pos HR HPV 2006 Self Breast exams: no TDaP: 10/06/12 HIV: 08/10/10 Labs: Declined   reports that she has never smoked. She has never used smokeless tobacco. She reports that she drinks alcohol. She reports that she does not use drugs.  Past Medical History:  Diagnosis Date  . Abnormal Pap smear 2006   ASCUS, pos HR HPV  . Allergic rhinitis, cause unspecified   . Common migraine with intractable migraine 04/03/2017  . Migraines    chronic  . Ovarian cyst     Past Surgical History:  Procedure Laterality Date  . FOOT SURGERY  L 01/2012; R 02/2012   bilateral taylor bunionectomies, and similar to regular bunionectomy surgeries (1st and 5th metatarsals bilaterally)  . TONSILLECTOMY  age 89    Current Outpatient Prescriptions  Medication Sig Dispense Refill  . folic acid (FOLVITE) 1 MG tablet Take 1 tablet (1 mg total) by mouth daily. 90 tablet 1  . frovatriptan (FROVA) 2.5 MG tablet TAKE 1 TABLET IF NEEDED, IF RECURS MAY REPEAT AFTER 2HRS *MAX 3 TABS/24 HOURS* 10  tablet 1  . Multiple Vitamins-Minerals (MULTI-VITAMIN GUMMIES PO) Take 1 each by mouth daily.    . naproxen sodium (ANAPROX) 220 MG tablet Take 220 mg by mouth 2 (two) times daily with a meal.    . nortriptyline (PAMELOR) 10 MG capsule Take one capsule at night for one week, then take 2 capsules at night for one week, then take 3 capsules at night 90 capsule 3  . ondansetron (ZOFRAN ODT) 4 MG disintegrating tablet Take 1 tablet (4 mg total) by mouth every 8 (eight) hours as needed for nausea or vomiting. 20 tablet 0   No current facility-administered medications for this visit.     Family History  Problem Relation Age of Onset  . Hodgkin's lymphoma Mother 96  . Thyroid cancer Mother     (from radiation/hodgkin's treatment)  . Breast cancer Mother 70    possibly related to radiation treatment for hodgkins  BRCA test is negative  . Cancer Mother     hodgkin's; thyroid; breast; now new tumor in thoracic spine from possible breast cancer.  . Cervical cancer Maternal Aunt 53    stage III - clinical trial - BRCA negative  . Hyperlipidemia Maternal Grandfather   . Diabetes Neg Hx   . Heart disease Neg Hx     ROS:  Pertinent items are noted in HPI.  Otherwise,  a comprehensive ROS was negative.  Exam:   BP 100/66 (BP Location: Right Arm, Patient Position: Sitting, Cuff Size: Normal)   Pulse (!) 56   Ht '5\' 6"'  (1.676 m)   Wt 145 lb (65.8 kg)   LMP 04/08/2017 (Exact Date)   BMI 23.40 kg/m  Height: '5\' 6"'  (167.6 cm) Ht Readings from Last 3 Encounters:  04/26/17 '5\' 6"'  (1.676 m)  04/03/17 '5\' 7"'  (1.702 m)  01/09/17 '5\' 7"'  (1.702 m)    General appearance: alert, cooperative and appears stated age Head: Normocephalic, without obvious abnormality, atraumatic Neck: no adenopathy, supple, symmetrical, trachea midline and thyroid normal to inspection and palpation Lungs: clear to auscultation bilaterally Breasts: normal appearance, no masses or tenderness Heart: regular rate and  rhythm Abdomen: soft, non-tender; no masses,  no organomegaly Extremities: extremities normal, atraumatic, no cyanosis or edema Skin: Skin color, texture, turgor normal. No rashes or lesions Lymph nodes: Cervical, supraclavicular, and axillary nodes normal. No abnormal inguinal nodes palpated Neurologic: Grossly normal   Pelvic: External genitalia:  no lesions              Urethra:  normal appearing urethra with no masses, tenderness or  lesions              Bartholin's and Skene's: normal                 Vagina: normal appearing vagina with normal color and discharge, no lesions              Cervix: anteverted              Pap taken: No. Bimanual Exam:  Uterus:  normal size, contour, position, consistency, mobility, non-tender              Adnexa: no mass, fullness, tenderness               Rectovaginal: Confirms               Anus:  normal sphincter tone, no lesions  Chaperone present: yes   A:  Well Woman with normal exam  Contraception with ParaGard IUD inserted 05/06/2015 and removed 12/06/16 History of migraine headaches with Aura  Pelvic pain  Condoms for birth control each time  Remote history of abnormal pap with ASCUS & HR HPV 2006  P:   Reviewed health and wellness pertinent to exam  Pap smear: no  Consulted with Dr. Sabra Heck with pt in office and will return for a consult visit with her to discuss scope.  Counseled on breast self exam, adequate intake of calcium and vitamin D, diet and exercise return annually or prn  An After Visit Summary was printed and given to the patient.

## 2017-05-03 ENCOUNTER — Ambulatory Visit (INDEPENDENT_AMBULATORY_CARE_PROVIDER_SITE_OTHER): Payer: BLUE CROSS/BLUE SHIELD | Admitting: Obstetrics & Gynecology

## 2017-05-03 VITALS — BP 120/60 | HR 100 | Resp 16 | Ht 66.0 in | Wt 146.0 lb

## 2017-05-03 DIAGNOSIS — R102 Pelvic and perineal pain: Secondary | ICD-10-CM

## 2017-05-03 DIAGNOSIS — G43109 Migraine with aura, not intractable, without status migrainosus: Secondary | ICD-10-CM | POA: Diagnosis not present

## 2017-05-03 NOTE — Progress Notes (Signed)
GYNECOLOGY  VISIT   HPI: 27 y.o. G65P0000 Married Caucasian female here for complaint of pelvic pain that has been worsening for months.  She did have a Paraguard IUD that was removed in December.  She was not sure if some of the pain was due to the IUD or to the irregular bleeding that occurred with the IUD.  However, the irregular bleeding stopped in January.  The pain has continued.  As well, she did have a PUS in 11/17 and 12/17 that showed bilateral ovarian cysts which resolved on follow up.  She thought ovarian cysts may have been a part of the pain but with resolution on ultrasound, this helped her figure out this isn't the issue.    Pain is associated with cycles.  This lasts 10-14 days and is always present before her menstrual cycle begins.  Then, the pain is present the first couple of days (which is usually just spotting).  Then her bleeding becomes heavy and the pain significantly improves.  She has 7-10 good days and then it starts all over again.  Flow lasts for six full days but is better than when she had the ParaGuard IUD.    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  She cannot take combination OCPs for this reason.  As well, she had taken POPs and did not do well on them.  Due to these side effects, she has been apprehensive to consider a progesterone IUD.  Patient and I have previously discussed laparoscopy for diagnostic and hopefully therapeutic purposes.  We discussed additional possibilities for pelvic pain including IC and IBS.   However, she does not have other GI symptoms and she does not have urinary symptoms.  She is interested in proceeding with laparoscopy.  Procedure discussed with patient.  Hospital stay, recovery and pain management all discussed.  Risks discussed including but not limited to bleeding, <1% risk of receiving a  transfusion, infection, <2% risk of bowel/bladder/ureteral/vascular injury discussed as well as possible need for  additional surgery if injury does occur discussed.  DVT/PE and rare risk of death discussed.  My actual complications with prior surgeries discussed.  Possible ablation of endometriosis as well as possible need for removal of an ovarian cysts and possible chromopertubation if tubal disease is noted was all reviewed.  She is comfortable with all of this.  She does not want an ovary removed without being able to consider the procedure first.  Reassured pt, even with significant disease that could not be fully treated with laparoscopy, treatment with Depo Lupron is possible.  She is ready to proceed.  She does not want to start with Depo Lupron at this time as she would like answers first.  All questions answered.    GYNECOLOGIC HISTORY: Patient's last menstrual period was 04/08/2017 (exact date). Contraception: condoms  Patient Active Problem List   Diagnosis Date Noted  . Common migraine with intractable migraine 04/03/2017  . Bilateral ovarian cysts 11/12/2016  . IUD check up 11/12/2016  . Menstrual migraine without status migrainosus, not intractable 03/07/2015    Past Medical History:  Diagnosis Date  . Abnormal Pap smear 2006   ASCUS, pos HR HPV  . Allergic rhinitis, cause unspecified   . Common migraine with intractable migraine 04/03/2017  . Migraines    chronic  . Ovarian cyst     Past Surgical History:  Procedure Laterality Date  . FOOT SURGERY  L 01/2012; R 02/2012   bilateral  taylor bunionectomies, and similar to regular bunionectomy surgeries (1st and 5th metatarsals bilaterally)  . TONSILLECTOMY  age 55    MEDS:  Reviewed in EPIC and UTD  ALLERGIES: Patient has no known allergies.  Family History  Problem Relation Age of Onset  . Hodgkin's lymphoma Mother 80  . Thyroid cancer Mother     (from radiation/hodgkin's treatment)  . Breast cancer Mother 72    possibly related to radiation treatment for hodgkins  BRCA test is negative  . Cancer Mother     hodgkin's;  thyroid; breast; now new tumor in thoracic spine from possible breast cancer.  . Cervical cancer Maternal Aunt 53    stage III - clinical trial - BRCA negative  . Hyperlipidemia Maternal Grandfather   . Diabetes Neg Hx   . Heart disease Neg Hx     SH:  Married, non smoker  Review of Systems  Genitourinary:       Pelvic pain  All other systems reviewed and are negative.   PHYSICAL EXAMINATION:    BP 120/60 (BP Location: Right Arm, Patient Position: Sitting, Cuff Size: Normal)   Pulse 100   Resp 16   Ht '5\' 6"'  (1.676 m)   Wt 146 lb (66.2 kg)   LMP 04/08/2017 (Exact Date)   BMI 23.57 kg/m     General appearance: alert, cooperative and appears stated age No additional exam performed today.  Pt here just for discussion of options.    Assessment: Chronic pelvic pain, consistent with endometriosis Migraines with aura, cannot take combination OCPs Failed Paraguard IUD due to bleeding and pain Failed POPs due to side effects  Plan: Diagnostic laparoscopy will be planned.  Pt has upcoming trial in late May so will need to look into June for possible dates.   ~30 minutes spent with patient >50% of time was in face to face discussion of above.

## 2017-05-04 ENCOUNTER — Encounter: Payer: Self-pay | Admitting: Obstetrics & Gynecology

## 2017-05-13 ENCOUNTER — Telehealth: Payer: Self-pay | Admitting: Obstetrics & Gynecology

## 2017-05-13 NOTE — Telephone Encounter (Signed)
Patient is scheduled for laparoscopy with possible treatment of endometriosis w/ J plasma laser on 06/17/2017 with Dr.Miller. This will be discussed with the patient at the time of pregnancy confirmation regarding next steps in care.  Cc: Billie RuddySally Yeakley, RN  Routing to provider for final review. Patient agreeable to disposition. Will close encounter.

## 2017-05-13 NOTE — Telephone Encounter (Signed)
Patient called and scheduled an appointment with Dr. Hyacinth MeekerMiller for 05/14/17 at 8:45. She took two positive urine pregnancy tests yesterday and wants to come in and confirm pregnancy with Dr. Hyacinth MeekerMiller. She also has some surgical procedures coming up that may be effected by this.

## 2017-05-14 ENCOUNTER — Ambulatory Visit (INDEPENDENT_AMBULATORY_CARE_PROVIDER_SITE_OTHER): Payer: BLUE CROSS/BLUE SHIELD | Admitting: Obstetrics & Gynecology

## 2017-05-14 ENCOUNTER — Encounter: Payer: Self-pay | Admitting: Obstetrics & Gynecology

## 2017-05-14 VITALS — BP 118/74 | HR 76 | Resp 12 | Ht 66.0 in | Wt 144.4 lb

## 2017-05-14 DIAGNOSIS — N912 Amenorrhea, unspecified: Secondary | ICD-10-CM

## 2017-05-14 DIAGNOSIS — Z3201 Encounter for pregnancy test, result positive: Secondary | ICD-10-CM | POA: Diagnosis not present

## 2017-05-14 LAB — POCT URINE PREGNANCY: PREG TEST UR: POSITIVE — AB

## 2017-05-14 NOTE — Progress Notes (Signed)
GYNECOLOGY  VISIT   HPI: 27 y.o. G68P0000 Married Caucasian female here for discussion of positive UPT.  Pt was not actively trying to conceive.  She is anxious about some of the medications she's been taking that she knows has some risks in pregnancy.  LMP is 04/08/17, however this was not a completley normal cycle so she's wondering if she conceived earlier than this.  If LMP is correct, conception likely around 04/22/17.  Pt stopped her Topamax before this time.  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  If present, this will not be able to be seen until the anatomy ultrasound as the face needs to be large enough to be able to see the defect.  However, if LMP is correct, there is no concern for this.    Pt has also taken frova and nortriptyline.  Reviewed these risks with pt as well but no structural defect has been identified.  These are class C in pregnancy and she is now off both of them.  She has been taking a multivitamin and extra folic acid as well.  D/w pt testing blood type and HCG quant to get an idea about when she conceived and when to do viability scan.  Will be able to use the viability scan to better determine conception and risks for pregnancy due to medications.  Pt was scheduled for laparoscopy due to cycle related pelvic pain.  She decided to take a UPT because her cycle was a little late and because the pain was much less this past month.    She denies nausea, pelvic pain, bleeding, breast tenderness.  She is having a lot of fatigue.    Reviewed with pt and spouse that she should not change kitty litter.  There are no cats in the home.  Tdap 10/06/12.  Had chicken pox.  Aware flu vaccine safe and recommended.  Rubella has not been tested as she was not considering pregnancy at this time.  Fish/shellfish/mercury discuss.  Unpasteurized cheese/juices discussed.  Nitrites in foods disucssed.  Exercise and intercourse discussed.  GYNECOLOGIC HISTORY: Patient's  last menstrual period was 04/08/2017.  Patient Active Problem List   Diagnosis Date Noted  . Common migraine with intractable migraine 04/03/2017  . Bilateral ovarian cysts 11/12/2016  . IUD check up 11/12/2016  . Menstrual migraine without status migrainosus, not intractable 03/07/2015    Past Medical History:  Diagnosis Date  . Abnormal Pap smear 2006   ASCUS, pos HR HPV  . Allergic rhinitis, cause unspecified   . Common migraine with intractable migraine 04/03/2017  . Migraines    chronic  . Ovarian cyst     Past Surgical History:  Procedure Laterality Date  . FOOT SURGERY  L 01/2012; R 02/2012   bilateral taylor bunionectomies, and similar to regular bunionectomy surgeries (1st and 5th metatarsals bilaterally)  . TONSILLECTOMY  age 51    MEDS:  Reviewed in EPIC and UTD  ALLERGIES: Patient has no known allergies.  Family History  Problem Relation Age of Onset  . Hodgkin's lymphoma Mother 32  . Thyroid cancer Mother        (from radiation/hodgkin's treatment)  . Breast cancer Mother 55       possibly related to radiation treatment for hodgkins  BRCA test is negative  . Cancer Mother        hodgkin's; thyroid; breast; now new tumor in thoracic spine from possible breast cancer.  . Cervical cancer Maternal Aunt 26  stage III - clinical trial - BRCA negative  . Hyperlipidemia Maternal Grandfather   . Diabetes Neg Hx   . Heart disease Neg Hx     SH:  Married, non smoker  Review of Systems  Constitutional: Positive for malaise/fatigue.  All other systems reviewed and are negative.   PHYSICAL EXAMINATION:    BP 118/74 (BP Location: Right Arm, Patient Position: Sitting, Cuff Size: Normal)   Pulse 76   Resp 12   Ht _0  (1.676 m)   Wt 144 lb 6.4 oz (65.5 kg)   LMP 04/08/2017   BMI 23.31 kg/m     Physical Exam  Constitutional: She is oriented to person, place, and time. She appears well-developed and well-nourished.  Neurological: She is alert and  oriented to person, place, and time.  Psychiatric: She has a normal mood and affect.   Assessment: Amenorrhea with +UPT  Plan: HCG and ABO will be obtained today.  Will plan repeat HCG depending on level and PUS in about two weeks.  May need to do this earlier if HCG is higher than expected.  Pt and spouse are comfortable with plan. Start regular PNV and stop MVI.  Continue folic acid.    ~08 minutes spent with patient >50% of time was in face to face discussion of above.

## 2017-05-15 ENCOUNTER — Other Ambulatory Visit: Payer: Self-pay | Admitting: *Deleted

## 2017-05-15 DIAGNOSIS — Z3201 Encounter for pregnancy test, result positive: Secondary | ICD-10-CM

## 2017-05-15 DIAGNOSIS — N912 Amenorrhea, unspecified: Secondary | ICD-10-CM

## 2017-05-15 LAB — HCG, QUANTITATIVE, PREGNANCY: HCG, BETA CHAIN, QUANT, S: 2824.2 m[IU]/mL — AB

## 2017-05-16 LAB — ABO AND RH: RH TYPE: NEGATIVE

## 2017-05-17 ENCOUNTER — Telehealth: Payer: Self-pay

## 2017-05-17 ENCOUNTER — Other Ambulatory Visit: Payer: BLUE CROSS/BLUE SHIELD

## 2017-05-17 NOTE — Telephone Encounter (Signed)
-----   Message from Jerene BearsMary S Miller, MD sent at 05/16/2017  5:59 PM EDT ----- Please let pt know her blood type is O negative.  She will need rhogam if has bleeding during the pregnancy from this point and going forward.  She is scheduled for ultrasound next week.

## 2017-05-17 NOTE — Telephone Encounter (Signed)
Left message for patient to call Summer back.  

## 2017-05-17 NOTE — Telephone Encounter (Signed)
Patient returning call.

## 2017-05-17 NOTE — Telephone Encounter (Signed)
Patient informed of results. Patient verbalized understanding and agreement.

## 2017-05-23 ENCOUNTER — Encounter: Payer: Self-pay | Admitting: Obstetrics & Gynecology

## 2017-05-23 ENCOUNTER — Ambulatory Visit (INDEPENDENT_AMBULATORY_CARE_PROVIDER_SITE_OTHER): Payer: BLUE CROSS/BLUE SHIELD | Admitting: Obstetrics & Gynecology

## 2017-05-23 ENCOUNTER — Ambulatory Visit (INDEPENDENT_AMBULATORY_CARE_PROVIDER_SITE_OTHER): Payer: BLUE CROSS/BLUE SHIELD

## 2017-05-23 VITALS — BP 100/62 | HR 80 | Resp 16 | Ht 66.0 in | Wt 144.0 lb

## 2017-05-23 DIAGNOSIS — Z3201 Encounter for pregnancy test, result positive: Secondary | ICD-10-CM

## 2017-05-23 DIAGNOSIS — G43019 Migraine without aura, intractable, without status migrainosus: Secondary | ICD-10-CM | POA: Diagnosis not present

## 2017-05-23 DIAGNOSIS — N912 Amenorrhea, unspecified: Secondary | ICD-10-CM

## 2017-05-23 DIAGNOSIS — Z3A01 Less than 8 weeks gestation of pregnancy: Secondary | ICD-10-CM | POA: Diagnosis not present

## 2017-05-23 NOTE — Progress Notes (Signed)
27 y.o. 660P0000 Married Caucasian female here for viability scan.  LMP 04/08/17.  EGA today is 6 3/7 weeks.  Pt had some concerns about possible exposure in early pregnancy of topamax.  Her HCG was a little higher then I expected so PUS was planned to help with accurate dating and to help establish if she actually had early Topamax exposure.    Reports fatigue and breast tenderness is present.  Not having nausea.  Did have some heart burn and read confusing/conflicting information on Internet.  Would like suggestions for common OTC products she can take.  Findings:  UTERUS: normal gestational sac, yolk sac, fetal pole with CRL 0.59 (CWD) and FCA at 112 BPM.  Singleton IUP noted. ADNEXA: Left ovary: 1.8 x 1.3cm       Right ovary: 3.7 x 2.3cm with 2.3 x 1.9cm right corpus luteal cyst CUL DE SAC: no free fluid  Discussion:  Findings reviewed.  Ultrasound is consistent with dating via LMP.  She was off her Topamax before she ovulated.  She is very relieved by this as is her spouse.  Common OTC products reviewed.    Reviewed with pt/spouse she should not change kitty litter.  They have no cats in the home.  Tdap 10/06/12.  Had chicken pox.  Aware flu vaccine safe and recommended.   Fish/shellfish/mercury discuss.  Patient does eat fish so will check current recommendations.  Unpasteurized cheese/juices discussed.  Nitrites in foods disucssed.  Exercise and intercourse discussed.  Fetal DNA particle testing discussed.  First trimester down's testing discussed.  Cystic fibrosis discussed.  Transfer of care is appropriate at this time.  Wished her well.  Assessment:  Single IUD with FCA 112 BPM today Migraines  Plan:  Options for OB care reviewed.  Information provided.  She will let me know when new ob appt has been made for transfer of records.  ~15 minutes spent with patient >50% of time was in face to face discussion of above.

## 2017-06-04 ENCOUNTER — Ambulatory Visit: Payer: BLUE CROSS/BLUE SHIELD | Admitting: Obstetrics & Gynecology

## 2017-06-04 DIAGNOSIS — Z3400 Encounter for supervision of normal first pregnancy, unspecified trimester: Secondary | ICD-10-CM | POA: Diagnosis not present

## 2017-06-04 DIAGNOSIS — Z3401 Encounter for supervision of normal first pregnancy, first trimester: Secondary | ICD-10-CM | POA: Diagnosis not present

## 2017-06-05 LAB — OB RESULTS CONSOLE ANTIBODY SCREEN: Antibody Screen: NEGATIVE

## 2017-06-05 LAB — OB RESULTS CONSOLE ABO/RH: RH Type: NEGATIVE

## 2017-06-05 LAB — OB RESULTS CONSOLE HEPATITIS B SURFACE ANTIGEN: Hepatitis B Surface Ag: NEGATIVE

## 2017-06-05 LAB — OB RESULTS CONSOLE GC/CHLAMYDIA
CHLAMYDIA, DNA PROBE: NEGATIVE
GC PROBE AMP, GENITAL: NEGATIVE

## 2017-06-05 LAB — OB RESULTS CONSOLE RUBELLA ANTIBODY, IGM: Rubella: IMMUNE

## 2017-06-05 LAB — OB RESULTS CONSOLE RPR: RPR: NONREACTIVE

## 2017-06-05 LAB — OB RESULTS CONSOLE HIV ANTIBODY (ROUTINE TESTING): HIV: NONREACTIVE

## 2017-06-17 ENCOUNTER — Ambulatory Visit: Admit: 2017-06-17 | Payer: Self-pay | Admitting: Obstetrics & Gynecology

## 2017-06-17 SURGERY — LAPAROSCOPY OPERATIVE
Anesthesia: General

## 2017-06-27 DIAGNOSIS — Z3401 Encounter for supervision of normal first pregnancy, first trimester: Secondary | ICD-10-CM | POA: Diagnosis not present

## 2017-07-01 ENCOUNTER — Ambulatory Visit: Payer: BLUE CROSS/BLUE SHIELD | Admitting: Obstetrics & Gynecology

## 2017-07-04 ENCOUNTER — Ambulatory Visit: Payer: BLUE CROSS/BLUE SHIELD | Admitting: Adult Health

## 2017-07-09 ENCOUNTER — Ambulatory Visit (INDEPENDENT_AMBULATORY_CARE_PROVIDER_SITE_OTHER): Payer: BLUE CROSS/BLUE SHIELD | Admitting: Family Medicine

## 2017-07-09 ENCOUNTER — Encounter: Payer: Self-pay | Admitting: Family Medicine

## 2017-07-09 VITALS — BP 100/70 | HR 99 | Temp 98.6°F | Wt 158.0 lb

## 2017-07-09 DIAGNOSIS — Z3490 Encounter for supervision of normal pregnancy, unspecified, unspecified trimester: Secondary | ICD-10-CM

## 2017-07-09 DIAGNOSIS — J029 Acute pharyngitis, unspecified: Secondary | ICD-10-CM | POA: Diagnosis not present

## 2017-07-09 DIAGNOSIS — Z331 Pregnant state, incidental: Secondary | ICD-10-CM

## 2017-07-09 LAB — POCT RAPID STREP A (OFFICE): Rapid Strep A Screen: NEGATIVE

## 2017-07-09 NOTE — Progress Notes (Signed)
   Subjective:    Patient ID: Marilyn Hunter, female    DOB: 1990/02/15, 27 y.o.   MRN: [id number]  HPI She complains of a 2 day history of sore throat with fatigue, slight diaphoresis as well as bilateral earache. No fever, cough or congestion. She is now 3 months pregnant.   Review of Systems     Objective:   Physical Exam Alert and in no distress. Tympanic membranes and canals are normal. Pharyngeal area is normal. Neck is supple without adenopathy or thyromegaly. Cardiac exam shows a regular sinus rhythm without murmurs or gallops. Lungs are clear to auscultation. Strep test is negative       Assessment & Plan:  Sore throat - Plan: POCT rapid strep A  Pharyngitis, unspecified etiology  IUP (intrauterine pregnancy), incidental Recommend supportive care with Tylenol. Explained that this could last for several weeks and include productive cough but to treat this symptomatically. She is comfortable with this.

## 2017-07-24 DIAGNOSIS — Z3481 Encounter for supervision of other normal pregnancy, first trimester: Secondary | ICD-10-CM | POA: Diagnosis not present

## 2017-07-24 DIAGNOSIS — Z3402 Encounter for supervision of normal first pregnancy, second trimester: Secondary | ICD-10-CM | POA: Diagnosis not present

## 2017-08-15 DIAGNOSIS — Z3400 Encounter for supervision of normal first pregnancy, unspecified trimester: Secondary | ICD-10-CM | POA: Diagnosis not present

## 2017-08-15 DIAGNOSIS — Z3402 Encounter for supervision of normal first pregnancy, second trimester: Secondary | ICD-10-CM | POA: Diagnosis not present

## 2017-08-26 DIAGNOSIS — Z3402 Encounter for supervision of normal first pregnancy, second trimester: Secondary | ICD-10-CM | POA: Diagnosis not present

## 2017-08-26 DIAGNOSIS — Z3A20 20 weeks gestation of pregnancy: Secondary | ICD-10-CM | POA: Diagnosis not present

## 2017-08-26 DIAGNOSIS — Z36 Encounter for antenatal screening for chromosomal anomalies: Secondary | ICD-10-CM | POA: Diagnosis not present

## 2017-09-12 DIAGNOSIS — Z23 Encounter for immunization: Secondary | ICD-10-CM | POA: Diagnosis not present

## 2017-10-01 ENCOUNTER — Ambulatory Visit: Payer: BLUE CROSS/BLUE SHIELD | Admitting: Adult Health

## 2017-10-08 DIAGNOSIS — Z3402 Encounter for supervision of normal first pregnancy, second trimester: Secondary | ICD-10-CM | POA: Diagnosis not present

## 2017-10-25 DIAGNOSIS — Z3402 Encounter for supervision of normal first pregnancy, second trimester: Secondary | ICD-10-CM | POA: Diagnosis not present

## 2017-10-29 DIAGNOSIS — Z6791 Unspecified blood type, Rh negative: Secondary | ICD-10-CM | POA: Diagnosis not present

## 2017-11-05 DIAGNOSIS — Z23 Encounter for immunization: Secondary | ICD-10-CM | POA: Diagnosis not present

## 2017-12-18 DIAGNOSIS — Z3403 Encounter for supervision of normal first pregnancy, third trimester: Secondary | ICD-10-CM | POA: Diagnosis not present

## 2017-12-20 ENCOUNTER — Inpatient Hospital Stay (HOSPITAL_COMMUNITY)
Admission: AD | Admit: 2017-12-20 | Payer: BLUE CROSS/BLUE SHIELD | Source: Ambulatory Visit | Admitting: Obstetrics & Gynecology

## 2017-12-26 LAB — OB RESULTS CONSOLE GBS: STREP GROUP B AG: POSITIVE

## 2017-12-31 NOTE — L&D Delivery Note (Signed)
Delivery Note At 6:08 PM a viable female was delivered via Vaginal, Spontaneous (Presentation: ROA ;  ).  APGAR: 7, 9; weight  pending   Placenta status: to L&D .  Cord:  with the following complications: none .  Cord pH: n/a  Anesthesia:  epidural Episiotomy: Right Mediolateral Lacerations: None Suture Repair: 2.0 3.0 vicryl Est. Blood Loss (mL): 300  Mom to postpartum.  Baby to Couplet care / Skin to Skin.  Alessandra BevelsJennifer M Azelie Noguera 01/16/2018, 6:30 PM

## 2018-01-06 ENCOUNTER — Encounter (HOSPITAL_COMMUNITY): Payer: Self-pay | Admitting: *Deleted

## 2018-01-06 ENCOUNTER — Telehealth (HOSPITAL_COMMUNITY): Payer: Self-pay | Admitting: *Deleted

## 2018-01-06 NOTE — Telephone Encounter (Signed)
Preadmission screen  

## 2018-01-15 NOTE — H&P (Signed)
HPI: 28 y/o G1P0 @ 3358w2d estimated gestational age (as dated by LMP c/w 20 week ultrasound) presents for scheduled IOL.   no Leaking of Fluid,   no Vaginal Bleeding,   no Uterine Contractions,  + Fetal Movement.  Prenatal care has been provided by Dr. Charlotta Newtonzan  ROS: no HA, no epigastric pain, no visual changes.    Pregnancy complicated by: -O neg, s/p RhoGAM (given 10/30) -GBS pos, plan for ABX in labor   Prenatal Transfer Tool  Maternal Diabetes: No Genetic Screening: Normal Maternal Ultrasounds/Referrals: Normal Fetal Ultrasounds or other Referrals:  None Maternal Substance Abuse:  No Significant Maternal Medications:  None Significant Maternal Lab Results: Lab values include: Group B Strep positive   PNL:  GBS POSITIVE, Rub Immune, Hep B neg, RPR NR, HIV neg, GC/C neg, glucola:93 Hgb: 11.8 Blood type: O neg  Immunizations: Tdap: 11/6 Flu: 9/13  OBHx: primip PMHx:  none Meds:  PNV Allergy:  No Known Allergies SurgHx: none SocHx:   no Tobacco, no  EtOH, no Illicit Drugs  O: LMP 04/08/2017  Gen. AAOx3, NAD CV.  RRR  No murmur.  Resp. CTAB, no wheeze or crackles. Abd. Gravid,  no tenderness,  no rigidity,  no guarding Extr.  1+ non-pitting edema B/L , no calf tenderness, neg Homan's B/L  FHT: 145 by doppler on 1/15 SVE: closed/soft/-3   Labs: see orders  A/P:  28 y.o. G1P0 @ 7669w3d EGA who presents for IOL for full term pregnancy -FWB:  Reassuring by doppler -Labor: plan for cytotec per protocol -GBS: positive, plan to start if rupture of membranes or when transitioning to active labor -Pain management: IV or epidural upon request  Myna HidalgoJennifer Blayn Whetsell, DO [phone] (pager) [phone] (office)

## 2018-01-16 ENCOUNTER — Inpatient Hospital Stay (HOSPITAL_COMMUNITY): Payer: BLUE CROSS/BLUE SHIELD | Admitting: Anesthesiology

## 2018-01-16 ENCOUNTER — Encounter (HOSPITAL_COMMUNITY): Payer: Self-pay

## 2018-01-16 ENCOUNTER — Inpatient Hospital Stay (HOSPITAL_COMMUNITY)
Admission: RE | Admit: 2018-01-16 | Discharge: 2018-01-18 | DRG: 807 | Disposition: A | Discharge status: Home / Self Care | Payer: BLUE CROSS/BLUE SHIELD | Source: Ambulatory Visit | Attending: Obstetrics & Gynecology | Admitting: Obstetrics & Gynecology

## 2018-01-16 DIAGNOSIS — O480 Post-term pregnancy: Principal | ICD-10-CM | POA: Diagnosis present

## 2018-01-16 DIAGNOSIS — Z3A40 40 weeks gestation of pregnancy: Secondary | ICD-10-CM | POA: Diagnosis not present

## 2018-01-16 DIAGNOSIS — O99824 Streptococcus B carrier state complicating childbirth: Secondary | ICD-10-CM | POA: Diagnosis not present

## 2018-01-16 DIAGNOSIS — Z23 Encounter for immunization: Secondary | ICD-10-CM | POA: Diagnosis not present

## 2018-01-16 LAB — CBC
HCT: 30.1 % — ABNORMAL LOW (ref 36.0–46.0)
Hemoglobin: 9.6 g/dL — ABNORMAL LOW (ref 12.0–15.0)
MCH: 27.1 pg (ref 26.0–34.0)
MCHC: 31.9 g/dL (ref 30.0–36.0)
MCV: 85 fL (ref 78.0–100.0)
PLATELETS: 275 10*3/uL (ref 150–400)
RBC: 3.54 MIL/uL — AB (ref 3.87–5.11)
RDW: 14.3 % (ref 11.5–15.5)
WBC: 8.8 10*3/uL (ref 4.0–10.5)

## 2018-01-16 LAB — TYPE AND SCREEN
ABO/RH(D): O NEG
Antibody Screen: NEGATIVE

## 2018-01-16 LAB — ABO/RH: ABO/RH(D): O NEG

## 2018-01-16 LAB — RPR: RPR Ser Ql: NONREACTIVE

## 2018-01-16 MED ORDER — OXYTOCIN BOLUS FROM INFUSION
500.0000 mL | Freq: Once | INTRAVENOUS | Status: AC
  Administered 2018-01-16: 500 mL via INTRAVENOUS

## 2018-01-16 MED ORDER — PHENYLEPHRINE 40 MCG/ML (10ML) SYRINGE FOR IV PUSH (FOR BLOOD PRESSURE SUPPORT): 80.0000 ug | PREFILLED_SYRINGE | INTRAVENOUS | Status: DC | PRN

## 2018-01-16 MED ORDER — OXYTOCIN 40 UNITS IN LACTATED RINGERS INFUSION - SIMPLE MED
2.5000 [IU]/h | INTRAVENOUS | Status: DC
Start: 2018-01-16 — End: 2018-01-18
  Filled 2018-01-16: qty 1000

## 2018-01-16 MED ORDER — EPHEDRINE 5 MG/ML INJ
10.0000 mg | INTRAVENOUS | Status: DC | PRN
  Filled 2018-01-16: qty 2

## 2018-01-16 MED ORDER — PENICILLIN G POTASSIUM 5000000 UNITS IJ SOLR
5.0000 10*6.[IU] | Freq: Once | INTRAVENOUS | Status: AC
  Administered 2018-01-16: 5 10*6.[IU] via INTRAVENOUS
  Filled 2018-01-16: qty 5

## 2018-01-16 MED ORDER — ONDANSETRON HCL 4 MG/2ML IJ SOLN: 4.0000 mg | INTRAMUSCULAR | Status: DC | PRN

## 2018-01-16 MED ORDER — LACTATED RINGERS IV SOLN: 500.0000 mL | Freq: Once | INTRAVENOUS | Status: DC

## 2018-01-16 MED ORDER — DIBUCAINE 1 % RE OINT: 1.0000 "application " | TOPICAL_OINTMENT | RECTAL | Status: DC | PRN

## 2018-01-16 MED ORDER — HYDROXYZINE HCL 50 MG PO TABS
50.0000 mg | ORAL_TABLET | Freq: Four times a day (QID) | ORAL | Status: DC | PRN
  Filled 2018-01-16: qty 1

## 2018-01-16 MED ORDER — ONDANSETRON HCL 4 MG PO TABS: 4.0000 mg | ORAL_TABLET | ORAL | Status: DC | PRN

## 2018-01-16 MED ORDER — COCONUT OIL OIL: 1.0000 "application " | TOPICAL_OIL | Status: DC | PRN

## 2018-01-16 MED ORDER — LIDOCAINE HCL (PF) 1 % IJ SOLN
INTRAMUSCULAR | Status: DC | PRN
  Administered 2018-01-16 (×2): 5 mL via EPIDURAL

## 2018-01-16 MED ORDER — LACTATED RINGERS IV SOLN: 500.0000 mL | INTRAVENOUS | Status: DC | PRN

## 2018-01-16 MED ORDER — LIDOCAINE HCL (PF) 1 % IJ SOLN
30.0000 mL | INTRAMUSCULAR | Status: DC | PRN
  Administered 2018-01-16: 30 mL via SUBCUTANEOUS
  Filled 2018-01-16: qty 30

## 2018-01-16 MED ORDER — OXYTOCIN 40 UNITS IN LACTATED RINGERS INFUSION - SIMPLE MED
1.0000 m[IU]/min | INTRAVENOUS | Status: DC
  Administered 2018-01-16: 1 m[IU]/min via INTRAVENOUS

## 2018-01-16 MED ORDER — LACTATED RINGERS IV SOLN
INTRAVENOUS | Status: DC
  Administered 2018-01-16 (×3): via INTRAVENOUS

## 2018-01-16 MED ORDER — BENZOCAINE-MENTHOL 20-0.5 % EX AERO: 1.0000 "application " | INHALATION_SPRAY | CUTANEOUS | Status: DC | PRN

## 2018-01-16 MED ORDER — DIPHENHYDRAMINE HCL 25 MG PO CAPS: 25.0000 mg | ORAL_CAPSULE | Freq: Four times a day (QID) | ORAL | Status: DC | PRN

## 2018-01-16 MED ORDER — ONDANSETRON HCL 4 MG/2ML IJ SOLN
4.0000 mg | Freq: Four times a day (QID) | INTRAMUSCULAR | Status: DC | PRN
  Administered 2018-01-16: 4 mg via INTRAVENOUS
  Filled 2018-01-16: qty 2

## 2018-01-16 MED ORDER — ZOLPIDEM TARTRATE 5 MG PO TABS: 5.0000 mg | ORAL_TABLET | Freq: Every evening | ORAL | Status: DC | PRN

## 2018-01-16 MED ORDER — OXYCODONE-ACETAMINOPHEN 5-325 MG PO TABS: 1.0000 | ORAL_TABLET | ORAL | Status: DC | PRN

## 2018-01-16 MED ORDER — FENTANYL 2.5 MCG/ML BUPIVACAINE 1/10 % EPIDURAL INFUSION (WH - ANES)
14.0000 mL/h | INTRAMUSCULAR | Status: DC | PRN
  Administered 2018-01-16 (×2): 14 mL/h via EPIDURAL
  Filled 2018-01-16 (×2): qty 100

## 2018-01-16 MED ORDER — TERBUTALINE SULFATE 1 MG/ML IJ SOLN
0.2500 mg | Freq: Once | INTRAMUSCULAR | Status: DC | PRN
  Filled 2018-01-16: qty 1

## 2018-01-16 MED ORDER — MISOPROSTOL 50MCG HALF TABLET
50.0000 ug | ORAL_TABLET | ORAL | Status: DC
  Administered 2018-01-16 (×2): 50 ug via ORAL
  Filled 2018-01-16 (×7): qty 1

## 2018-01-16 MED ORDER — DIPHENHYDRAMINE HCL 50 MG/ML IJ SOLN: 12.5000 mg | INTRAMUSCULAR | Status: DC | PRN

## 2018-01-16 MED ORDER — PHENYLEPHRINE 40 MCG/ML (10ML) SYRINGE FOR IV PUSH (FOR BLOOD PRESSURE SUPPORT)
80.0000 ug | PREFILLED_SYRINGE | INTRAVENOUS | Status: DC | PRN
  Filled 2018-01-16: qty 5

## 2018-01-16 MED ORDER — SENNOSIDES-DOCUSATE SODIUM 8.6-50 MG PO TABS
2.0000 | ORAL_TABLET | ORAL | Status: DC
  Administered 2018-01-17 (×2): 2 via ORAL
  Filled 2018-01-16 (×2): qty 2

## 2018-01-16 MED ORDER — ACETAMINOPHEN 325 MG PO TABS: 650.0000 mg | ORAL_TABLET | ORAL | Status: DC | PRN

## 2018-01-16 MED ORDER — LACTATED RINGERS IV SOLN
500.0000 mL | Freq: Once | INTRAVENOUS | Status: AC
  Administered 2018-01-16: 500 mL via INTRAVENOUS

## 2018-01-16 MED ORDER — EPHEDRINE 5 MG/ML INJ: 10.0000 mg | INTRAVENOUS | Status: DC | PRN

## 2018-01-16 MED ORDER — PRENATAL MULTIVITAMIN CH
1.0000 | ORAL_TABLET | Freq: Every day | ORAL | Status: DC
  Administered 2018-01-17: 1 via ORAL
  Filled 2018-01-16: qty 1

## 2018-01-16 MED ORDER — PHENYLEPHRINE 40 MCG/ML (10ML) SYRINGE FOR IV PUSH (FOR BLOOD PRESSURE SUPPORT)
80.0000 ug | PREFILLED_SYRINGE | INTRAVENOUS | Status: DC | PRN
  Filled 2018-01-16: qty 5
  Filled 2018-01-16: qty 10

## 2018-01-16 MED ORDER — PENICILLIN G POT IN DEXTROSE 60000 UNIT/ML IV SOLN
3.0000 10*6.[IU] | INTRAVENOUS | Status: DC
  Administered 2018-01-16: 3 10*6.[IU] via INTRAVENOUS
  Filled 2018-01-16 (×8): qty 50

## 2018-01-16 MED ORDER — IBUPROFEN 600 MG PO TABS
600.0000 mg | ORAL_TABLET | Freq: Four times a day (QID) | ORAL | Status: DC
  Administered 2018-01-16 – 2018-01-18 (×6): 600 mg via ORAL
  Filled 2018-01-16 (×6): qty 1

## 2018-01-16 MED ORDER — SIMETHICONE 80 MG PO CHEW: 80.0000 mg | CHEWABLE_TABLET | ORAL | Status: DC | PRN

## 2018-01-16 MED ORDER — OXYCODONE-ACETAMINOPHEN 5-325 MG PO TABS: 2.0000 | ORAL_TABLET | ORAL | Status: DC | PRN

## 2018-01-16 MED ORDER — FENTANYL CITRATE (PF) 100 MCG/2ML IJ SOLN
50.0000 ug | INTRAMUSCULAR | Status: DC | PRN
  Administered 2018-01-16 (×2): 100 ug via INTRAVENOUS
  Filled 2018-01-16 (×2): qty 2

## 2018-01-16 MED ORDER — WITCH HAZEL-GLYCERIN EX PADS: 1.0000 "application " | MEDICATED_PAD | CUTANEOUS | Status: DC | PRN

## 2018-01-16 MED ORDER — SOD CITRATE-CITRIC ACID 500-334 MG/5ML PO SOLN: 30.0000 mL | ORAL | Status: DC | PRN

## 2018-01-16 NOTE — Progress Notes (Signed)
Patient in room; beginning admission for IOL.  Lenox PondsAlexandria M Kyrian Stage, RN

## 2018-01-16 NOTE — Progress Notes (Signed)
OB PN:  S: Pt resting comfortably, starting to feel painful contractions.    O: BP (!) 125/94   Pulse 88   Temp 98.1 F (36.7 C) (Oral)   Resp 16   Ht 5\' 7"  (1.702 m)   Wt 95 kg (209 lb 8 oz)   LMP 04/08/2017   BMI 32.81 kg/m   FHT: 130bpm, moderate variablity, + accels, no decels Toco: irregular SVE: deferred  A/P: 28 y.o. G1P0 @ 6065w3d for IOL 1. FWB: Cat. I 2. Labor: 2nd dose cytotec given this am Pain: epidural or IV medication upon request GBS: positive, plan to start PCN when in active labor or with rupture of membranes Ok for light breakfast this am, then back to CLD only  Myna HidalgoJennifer Latonia Conrow, DO [phone] (cell) [phone] (office)

## 2018-01-16 NOTE — Anesthesia Preprocedure Evaluation (Signed)
Anesthesia Evaluation  Patient identified by MRN, date of birth, ID band Patient awake    Reviewed: Allergy & Precautions, H&P , NPO status , Patient's Chart, lab work & pertinent test results, reviewed documented beta blocker date and time   Airway Mallampati: I  TM Distance: >3 FB Neck ROM: full    Dental no notable dental hx.    Pulmonary neg pulmonary ROS,    Pulmonary exam normal breath sounds clear to auscultation       Cardiovascular negative cardio ROS Normal cardiovascular exam Rhythm:regular Rate:Normal     Neuro/Psych negative neurological ROS  negative psych ROS   GI/Hepatic negative GI ROS, Neg liver ROS,   Endo/Other  negative endocrine ROS  Renal/GU negative Renal ROS  negative genitourinary   Musculoskeletal   Abdominal   Peds  Hematology negative hematology ROS (+)   Anesthesia Other Findings   Reproductive/Obstetrics (+) Pregnancy                             Anesthesia Physical Anesthesia Plan  ASA: II  Anesthesia Plan: Epidural   Post-op Pain Management:    Induction:   PONV Risk Score and Plan:   Airway Management Planned:   Additional Equipment:   Intra-op Plan:   Post-operative Plan:   Informed Consent: I have reviewed the patients History and Physical, chart, labs and discussed the procedure including the risks, benefits and alternatives for the proposed anesthesia with the patient or authorized representative who has indicated his/her understanding and acceptance.     Plan Discussed with:   Anesthesia Plan Comments:         Anesthesia Quick Evaluation  

## 2018-01-16 NOTE — Anesthesia Pain Management Evaluation Note (Signed)
  CRNA Pain Management Visit Note  Patient: Marilyn Hunter, 28 y.o., female  "Hello I am a [facility] of the anesthesia [facility] at [facility]. We have an anesthesia team available at all times to provide care throughout the hospital, including epidural management and anesthesia for C-section. I don't know your plan for the delivery whether it a natural birth, water birth, IV sedation, nitrous supplementation, doula or epidural, but we want to meet your pain goals."   1.Was your pain managed to your expectations on prior hospitalizations?   No prior hospitalizations  2.What is your expectation for pain management during this hospitalization?     Epidural  3.How can we help you reach that goal? Epidural when Pain Goal is reached.  Record the patient's initial score and the patient's pain goal.   Pain: 5  Pain Goal: 7 [facility] wants you to be able to say your pain was always managed very well.  Findley Vi 01/16/2018

## 2018-01-16 NOTE — Progress Notes (Signed)
OB PN:  S: Pt resting comfortably, starting to feel painful contractions.    O: BP (!) 125/94   Pulse 88   Temp 98.1 F (36.7 C) (Oral)   Resp 16   Ht 5\' 7"  (1.702 m)   Wt 95 kg (209 lb 8 oz)   LMP 04/08/2017   BMI 32.81 kg/m   FHT: 150bpm, moderate variablity, + accels, early decels Toco: q1082min SVE: C/C/+2  A/P: 28 y.o. G1P0 @ 6539w3d for IOL 1. FWB: Cat. I 2. Labor: continue Pit per protocol Pain: continue epidural GBS: positive, on PCN  Plan to start pushing  Marilyn HidalgoJennifer Adelaida Reindel, DO [phone] (cell) [phone] (office)

## 2018-01-16 NOTE — Anesthesia Procedure Notes (Signed)
Epidural Patient location during procedure: OB Start time: 01/16/2018 10:40 AM End time: 01/16/2018 10:48 AM  Staffing Anesthesiologist: Bethena Midgetddono, Ryelee Albee, MD  Preanesthetic Checklist Completed: patient identified, site marked, surgical consent, pre-op evaluation, timeout performed, IV checked, risks and benefits discussed and monitors and equipment checked  Epidural Patient position: sitting Prep: site prepped and draped and DuraPrep Patient monitoring: continuous pulse ox and blood pressure Approach: midline Location: L3-L4 Injection technique: LOR air  Needle:  Needle type: Tuohy  Needle gauge: 17 G Needle length: 9 cm and 9 Needle insertion depth: 8 cm Catheter type: closed end flexible Catheter size: 19 Gauge Catheter at skin depth: 13 cm Test dose: negative  Assessment Events: blood not aspirated, injection not painful, no injection resistance, negative IV test and no paresthesia

## 2018-01-17 ENCOUNTER — Other Ambulatory Visit: Payer: Self-pay

## 2018-01-17 LAB — CBC
HEMATOCRIT: 30.6 % — AB (ref 36.0–46.0)
Hemoglobin: 10.1 g/dL — ABNORMAL LOW (ref 12.0–15.0)
MCH: 27.8 pg (ref 26.0–34.0)
MCHC: 33 g/dL (ref 30.0–36.0)
MCV: 84.3 fL (ref 78.0–100.0)
Platelets: 267 10*3/uL (ref 150–400)
RBC: 3.63 MIL/uL — ABNORMAL LOW (ref 3.87–5.11)
RDW: 14.5 % (ref 11.5–15.5)
WBC: 17.9 10*3/uL — AB (ref 4.0–10.5)

## 2018-01-17 MED ORDER — RHO D IMMUNE GLOBULIN 1500 UNIT/2ML IJ SOSY
300.0000 ug | PREFILLED_SYRINGE | Freq: Once | INTRAMUSCULAR | Status: AC
Start: 2018-01-17 — End: 2018-01-17
  Administered 2018-01-17: 300 ug via INTRAVENOUS
  Filled 2018-01-17: qty 2

## 2018-01-17 NOTE — Progress Notes (Signed)
Postpartum Note Day # 1  S:  Patient resting comfortable in bed.  Pain controlled.  Tolerating general diet. + flatus, no BM.  Lochia moderate.  Ambulating without difficulty.  She denies n/v/f/c, SOB, or CP.  Pt plans on breastfeeding.  O: Temp:  [97.6 F (36.4 C)-99.1 F (37.3 C)] 99 F (37.2 C) (01/18 0030) Pulse Rate:  [77-158] 85 (01/18 0030) Resp:  [16-20] 18 (01/18 0030) BP: (98-152)/(59-112) 104/59 (01/18 0030) SpO2:  [99 %-100 %] 99 % (01/17 2120)   Gen: A&Ox3, NAD CV: RRR, no MRG Resp: CTAB Abdomen: soft, NT, ND Uterus: firm, non-tender, below umbilicus Ext: No edema, no calf tenderness bilaterally  Labs:  Recent Labs    01/16/18 0210 01/17/18 0538  HGB 9.6* 10.1*    A/P: Pt is a 28 y.o. G1P1001 s/p NSVD, #1  - Pain well controlled -GU: UOP is adequate -GI: Tolerating general diet -Activity: encouraged sitting up to chair and ambulation as tolerated -Prophylaxis: early ambulation -Labs: stable as above -Baby boy circ to be completed later today  DISPO: Continue with routine postpartum care  Myna HidalgoJennifer Tatelyn Vanhecke, DO [phone] (pager) [phone] (office)

## 2018-01-17 NOTE — Anesthesia Postprocedure Evaluation (Signed)
Anesthesia Post Note  Patient: Marilyn Hunter  Procedure(s) Performed: AN AD HOC LABOR EPIDURAL     Patient location during evaluation: Mother Baby Anesthesia Type: Epidural Level of consciousness: awake, awake and alert, oriented and patient cooperative Pain management: pain level controlled Vital Signs Assessment: post-procedure vital signs reviewed and stable Respiratory status: spontaneous breathing, nonlabored ventilation and respiratory function stable Cardiovascular status: stable Postop Assessment: no headache, patient able to bend at knees, no apparent nausea or vomiting and no backache Anesthetic complications: no    Last Vitals:  Vitals:   01/17/18 0030 01/17/18 0745  BP: (!) 104/59 120/68  Pulse: 85 82  Resp: 18 18  Temp: 37.2 C 37 C  SpO2:  97%    Last Pain:  Vitals:   01/17/18 0745  TempSrc: Oral  PainSc:    Pain Goal:                 Marilyn Hunter

## 2018-01-17 NOTE — Lactation Note (Signed)
This note was copied from a baby's chart. Lactation Consultation Note  Patient Name: Boy Rainey [place] Foll[place]bee RUEAV'WToday's Date: 01/17/2018 Reason for consult: Initial assessment   P1, Baby 16 hours old. Baby latched in cross cradle upon entering. Baby comes off and on during feeding but with breast compression he sustains. Mother states RN demonstrated how to hand express. Reviewed basics. Mom encouraged to feed baby 8-12 times/24 hours and with feeding cues.  Mom made aware of O/P services, breastfeeding support groups, community resources, and our phone # for post-discharge questions.     Maternal Data Has patient been taught Hand Expression?: Yes Does the patient have breastfeeding experience prior to this delivery?: No  Feeding Feeding Type: Breast Fed Length of feed: 5 min  LATCH Score Latch: Grasps breast easily, tongue down, lips flanged, rhythmical sucking.(latched and relatched during consult)  Audible Swallowing: A few with stimulation  Type of Nipple: Everted at rest and after stimulation  Comfort (Breast/Nipple): Soft / non-tender  Hold (Positioning): Assistance needed to correctly position infant at breast and maintain latch.  LATCH Score: 8  Interventions Interventions: Breast feeding basics reviewed  Lactation Tools Discussed/Used     Consult Status Consult Status: Follow-up Date: 01/18/18 Follow-up type: In-patient    Dahlia ByesBerkelhammer, [facility] 01/17/2018, 10:56 AM

## 2018-01-18 LAB — RH IG WORKUP (INCLUDES ABO/RH)
ABO/RH(D): O NEG
FETAL SCREEN: NEGATIVE
Gestational Age(Wks): 40.3
UNIT DIVISION: 0

## 2018-01-18 MED ORDER — IBUPROFEN 600 MG PO TABS: 600.0000 mg | ORAL_TABLET | Freq: Four times a day (QID) | ORAL | 0 refills | Status: DC

## 2018-01-18 NOTE — Discharge Summary (Signed)
OB Discharge Summary     Patient Name: Marilyn Hunter DOB: 1990/05/04 MRN: [id number]  Date of admission: 01/16/2018 Delivering MD: Myna Hidalgo   Date of discharge: 01/18/2018  Admitting diagnosis: INDUCTION Intrauterine pregnancy: [redacted]w[redacted]d     Secondary diagnosis:  Active Problems:   Labor and delivery, indication for care  Additional problems:      Discharge diagnosis: Term Pregnancy Delivered                                                                                                Post partum procedures:rhogam  Augmentation: AROM  Complications: None  Hospital course:  Induction of Labor With Vaginal Delivery   28 y.o. yo G1P1001 at [redacted]w[redacted]d was admitted to the hospital 01/16/2018 for induction of labor.  Indication for induction: postdattes.  Patient had an uncomplicated labor course as follows: Membrane Rupture Time/Date: 3:28 PM ,01/16/2018   Intrapartum Procedures: Episiotomy: Right Mediolateral [4]                                         Lacerations:  None [1]  Patient had delivery of a Viable infant.  Information for the patient's newborn:  Etrulia, Zarr [[id number]]  Delivery Method: Vag-Spont   01/16/2018  Details of delivery can be found in separate delivery note.  Patient had a routine postpartum course. Patient is discharged home 01/18/18.  Physical exam  Vitals:   01/16/18 2120 01/17/18 0030 01/17/18 0745 01/17/18 1914  BP: 114/60 (!) 104/59 120/68 124/71  Pulse: 92 85 82 88  Resp: 18 18 18 18   Temp: 98.9 F (37.2 C) 99 F (37.2 C) 98.6 F (37 C) 98.1 F (36.7 C)  TempSrc: Oral Oral Oral Oral  SpO2: 99%  97%   Weight:      Height:       General: alert and cooperative Lochia: appropriate Uterine Fundus: firm Incision:  DVT Evaluation: No evidence of DVT seen on physical exam. Labs: Lab Results  Component Value Date   WBC 17.9 (H) 01/17/2018   HGB 10.1 (L) 01/17/2018   HCT 30.6 (L) 01/17/2018   MCV 84.3 01/17/2018   PLT 267  01/17/2018   No flowsheet data found.  Discharge instruction: per After Visit Summary and "Baby and Me Booklet".  After visit meds:  Allergies as of 01/18/2018   No Known Allergies     Medication List    STOP taking these medications   calcium carbonate 500 MG chewable tablet Commonly known as:  TUMS - dosed in mg elemental calcium   folic acid 1 MG tablet Commonly known as:  FOLVITE   ondansetron 4 MG disintegrating tablet Commonly known as:  ZOFRAN ODT   prenatal multivitamin Tabs tablet     TAKE these medications   ibuprofen 600 MG tablet Commonly known as:  ADVIL,MOTRIN Take 1 tablet (600 mg total) by mouth every 6 (six) hours.       Diet: routine diet  Activity: Advance as tolerated. Pelvic rest for 6  weeks.   Outpatient follow up:6 weeks Follow up Appt:No future appointments. Follow up Visit:No Follow-up on file.  Postpartum contraception: Not Discussed  Newborn Data: Live born female  Birth Weight: 8 lb 0.6 oz (3646 g) APGAR: 7, 9  Newborn Delivery   Birth date/time:  01/16/2018 18:08:00 Delivery type:  Vaginal, Spontaneous     Baby Feeding: Breast Disposition:home with mother   01/18/2018 Rhea PinkLori A Rashaun Curl, CNM

## 2018-02-27 DIAGNOSIS — Z30430 Encounter for insertion of intrauterine contraceptive device: Secondary | ICD-10-CM | POA: Diagnosis not present

## 2018-02-27 DIAGNOSIS — Z124 Encounter for screening for malignant neoplasm of cervix: Secondary | ICD-10-CM | POA: Diagnosis not present

## 2018-02-27 DIAGNOSIS — R87610 Atypical squamous cells of undetermined significance on cytologic smear of cervix (ASC-US): Secondary | ICD-10-CM | POA: Diagnosis not present

## 2018-03-06 LAB — RESULTS CONSOLE HPV: CHL HPV: POSITIVE

## 2018-03-06 LAB — HM PAP SMEAR

## 2018-03-26 DIAGNOSIS — Z30431 Encounter for routine checking of intrauterine contraceptive device: Secondary | ICD-10-CM | POA: Diagnosis not present

## 2018-03-26 DIAGNOSIS — N888 Other specified noninflammatory disorders of cervix uteri: Secondary | ICD-10-CM | POA: Diagnosis not present

## 2018-03-26 DIAGNOSIS — R87810 Cervical high risk human papillomavirus (HPV) DNA test positive: Secondary | ICD-10-CM | POA: Diagnosis not present

## 2018-03-26 DIAGNOSIS — N871 Moderate cervical dysplasia: Secondary | ICD-10-CM | POA: Diagnosis not present

## 2018-03-26 DIAGNOSIS — N879 Dysplasia of cervix uteri, unspecified: Secondary | ICD-10-CM | POA: Diagnosis not present

## 2018-03-26 DIAGNOSIS — Z3202 Encounter for pregnancy test, result negative: Secondary | ICD-10-CM | POA: Diagnosis not present

## 2018-04-28 ENCOUNTER — Ambulatory Visit: Payer: BLUE CROSS/BLUE SHIELD | Admitting: Nurse Practitioner

## 2018-07-15 DIAGNOSIS — N871 Moderate cervical dysplasia: Secondary | ICD-10-CM | POA: Diagnosis not present

## 2018-07-15 DIAGNOSIS — Z975 Presence of (intrauterine) contraceptive device: Secondary | ICD-10-CM | POA: Diagnosis not present

## 2018-07-15 DIAGNOSIS — N870 Mild cervical dysplasia: Secondary | ICD-10-CM | POA: Diagnosis not present

## 2019-03-06 ENCOUNTER — Encounter: Payer: Self-pay | Admitting: Family Medicine

## 2019-03-06 ENCOUNTER — Ambulatory Visit: Payer: BLUE CROSS/BLUE SHIELD | Admitting: Family Medicine

## 2019-03-06 VITALS — BP 126/86 | HR 98 | Temp 97.7°F | Wt 172.6 lb

## 2019-03-06 DIAGNOSIS — J209 Acute bronchitis, unspecified: Secondary | ICD-10-CM | POA: Diagnosis not present

## 2019-03-06 MED ORDER — AMOXICILLIN 875 MG PO TABS
875.0000 mg | ORAL_TABLET | Freq: Two times a day (BID) | ORAL | 0 refills | Status: DC
Start: 2019-03-06 — End: 2019-11-04

## 2019-03-06 NOTE — Progress Notes (Signed)
   Subjective:    Patient ID: Marilyn Hunter, female    DOB: February 13, 1990, 29 y.o.   MRN: [id number]  HPI 4 days ago she developed rhinorrhea, nasal congestion and slight facial discomfort.  2 days ago she developed a cough that became productive with fever fatigue and malaise.  She is breast-feeding.   Review of Systems     Objective:   Physical Exam Alert and in no distress. Tympanic membranes and canals are normal. Pharyngeal area is normal. Neck is supple without adenopathy or thyromegaly. Cardiac exam shows a regular sinus rhythm without murmurs or gallops. Lungs are clear to auscultation.        Assessment & Plan:  Acute bronchitis, unspecified organism - Plan: amoxicillin (AMOXIL) 875 MG tablet I have decided to treat her since she had a relatively abrupt onset of a productive cough.  She was comfortable with that.  She is to call me if not entirely better when she finishes the antibiotic

## 2019-03-06 NOTE — Patient Instructions (Signed)
Continue on Tylenol you can also use 2 Aleve twice per day.  Also use Afrin nasal spray only at night. Take all the antibiotic and if not totally back to normal when you finish call me

## 2019-08-23 ENCOUNTER — Other Ambulatory Visit: Payer: Self-pay | Admitting: Family Medicine

## 2019-08-23 MED ORDER — SULFAMETHOXAZOLE-TRIMETHOPRIM 800-160 MG PO TABS: 1.0000 | ORAL_TABLET | Freq: Two times a day (BID) | ORAL | 0 refills | Status: DC

## 2019-08-23 NOTE — Progress Notes (Signed)
She has dysuria and frequency. I will call in Septra

## 2019-10-08 ENCOUNTER — Ambulatory Visit: Payer: BC Managed Care – PPO | Admitting: Family Medicine

## 2019-10-08 ENCOUNTER — Encounter: Payer: Self-pay | Admitting: Family Medicine

## 2019-10-08 ENCOUNTER — Other Ambulatory Visit: Payer: Self-pay

## 2019-10-08 VITALS — Temp 97.6°F | Wt 167.0 lb

## 2019-10-08 DIAGNOSIS — G43829 Menstrual migraine, not intractable, without status migrainosus: Secondary | ICD-10-CM | POA: Diagnosis not present

## 2019-10-08 MED ORDER — TOPIRAMATE 25 MG PO TABS: 25.0000 mg | ORAL_TABLET | Freq: Two times a day (BID) | ORAL | 1 refills | Status: DC

## 2019-10-08 MED ORDER — FROVATRIPTAN SUCCINATE 2.5 MG PO TABS: 2.5000 mg | ORAL_TABLET | ORAL | 1 refills | Status: DC | PRN

## 2019-10-08 NOTE — Progress Notes (Signed)
   Subjective:    Patient ID: Marilyn Hunter, female    DOB: 29-Jun-1990, 29 y.o.   MRN: [id number]  HPI Documentation for virtual telephone encounter. Documentation for virtual audio and video telecommunications through [facility] encounter: The patient was located at home. The provider was located in the office. The patient did consent to this visit and is aware of possible charges through their insurance for this visit. The other persons participating in this telemedicine service were none. This virtual service is not related to other E/M service within previous 7 days. She has a history of menstrual migraines however stopped her Topamax and Frova when she became pregnant and continued off this while she was breast-feeding.  She is no longer breast-feeding and has started having regular cycles again.  Her headaches have reoccurred.  She would like to be placed back on these medications.     Review of Systems     Objective:   Physical Exam Alert and in no distress otherwise not examined       Assessment & Plan:  Menstrual migraine without status migrainosus, not intractable - Plan: topiramate (TOPAMAX) 25 MG tablet, frovatriptan (FROVA) 2.5 MG tablet I will start her back on Topamax 25 twice daily.  She will keep me informed as to the success of this.  We will probably have to go to a higher dose and she is comfortable with that.  We will try to decrease the intensity and the number of migraines.  Frova was redone.  Recommend she add Aleve to the regimen.

## 2019-10-09 ENCOUNTER — Telehealth: Payer: Self-pay | Admitting: Family Medicine

## 2019-10-09 NOTE — Telephone Encounter (Signed)
Pt called & states Frova requires P.A., she has tried Topamax, Relpax, Imitrex, Maxalt, Nortriptyline, Naproxen, had migraines 15 years.  Been on Frova since 06/25/11

## 2019-10-10 NOTE — Telephone Encounter (Signed)
P.A. FROVA

## 2019-10-11 NOTE — Telephone Encounter (Signed)
P.A. approved til 10/07/22, pt informed

## 2019-11-04 ENCOUNTER — Other Ambulatory Visit: Payer: Self-pay

## 2019-11-04 ENCOUNTER — Ambulatory Visit: Payer: BC Managed Care – PPO | Admitting: Medical

## 2019-11-04 ENCOUNTER — Encounter: Payer: Self-pay | Admitting: Medical

## 2019-11-04 VITALS — BP 114/76 | HR 105 | Temp 97.3°F | Ht 67.0 in | Wt 164.8 lb

## 2019-11-04 DIAGNOSIS — G43811 Other migraine, intractable, with status migrainosus: Secondary | ICD-10-CM | POA: Diagnosis not present

## 2019-11-04 DIAGNOSIS — R112 Nausea with vomiting, unspecified: Secondary | ICD-10-CM | POA: Diagnosis not present

## 2019-11-04 DIAGNOSIS — G43831 Menstrual migraine, intractable, with status migrainosus: Secondary | ICD-10-CM | POA: Diagnosis not present

## 2019-11-04 MED ORDER — PROMETHAZINE HCL 25 MG/ML IJ SOLN
25.0000 mg | Freq: Once | INTRAMUSCULAR | Status: AC
  Administered 2019-11-04: 25 mg via INTRAMUSCULAR

## 2019-11-04 MED ORDER — KETOROLAC TROMETHAMINE 60 MG/2ML IM SOLN
60.0000 mg | Freq: Once | INTRAMUSCULAR | Status: AC
  Administered 2019-11-04: 60 mg via INTRAMUSCULAR

## 2019-11-04 MED ORDER — PROMETHAZINE HCL 25 MG PO TABS: 25.0000 mg | ORAL_TABLET | Freq: Three times a day (TID) | ORAL | 0 refills | Status: DC | PRN

## 2019-11-04 MED ORDER — HYDROCODONE-ACETAMINOPHEN 5-325 MG PO TABS: 1.0000 | ORAL_TABLET | Freq: Four times a day (QID) | ORAL | 0 refills | Status: DC | PRN

## 2019-11-04 NOTE — Progress Notes (Signed)
Subjective:  Marilyn Hunter is a 29 y.o. female who presents for Chief Complaint  Patient presents with  . Migraine    with vomiting      Here for migraine  She has hx/o migraines starting years ago, hx/o menstrual migraines.   Migraines improved when she was pregnant.   However, since she had her baby and now back on IUD, she has started back getting migraines.   She just saw Dr. Susann Hunter her PCP here  Back about a month ago to restart her old regimen of Topamax for prevention and Frova for abortive therapy.   However, she is still getting quite a few migraines per month.  She currently has migraine that started 5 days ago, not improved with several doses of Frova.  Unilateral, throbbing, with nausea and vomiting which is her typical presentation.   A friend drove her here today to get a shot since the frova hasn't helped.   She has no unusual symptoms.   Her period is about to start and this is similar to the way her migraines normally start.   She hasn't done well on amitriptyline in the past.  No other prior therapies.   She has had several episodes of nausea and vomiting today.  No other aggravating or relieving factors. No other complaint.  The following portions of the patient's history were reviewed and updated as appropriate: allergies, current medications, past family history, past medical history, past social history, past surgical history and problem list.  ROS Otherwise as in subjective above  Past Medical History:  Diagnosis Date  . Abnormal Pap smear 2006   ASCUS, pos HR HPV  . Allergic rhinitis, cause unspecified   . Common migraine with intractable migraine 04/03/2017  . Migraines    chronic  . Ovarian cyst   . Vaginal Pap smear, abnormal     Current Outpatient Medications on File Prior to Visit  Medication Sig Dispense Refill  . acetaminophen (TYLENOL) 500 MG tablet Take 500 mg by mouth every 6 (six) hours as needed.    . frovatriptan (FROVA) 2.5 MG tablet Take 1 tablet  (2.5 mg total) by mouth as needed for migraine. If recurs, may repeat after 2 hours. Max of 3 tabs in 24 hours. 10 tablet 1  . topiramate (TOPAMAX) 25 MG tablet Take 1 tablet (25 mg total) by mouth 2 (two) times daily. 60 tablet 1   No current facility-administered medications on file prior to visit.     Objective: BP 114/76   Pulse (!) 105   Temp (!) 97.3 F (36.3 C)   Ht 5\' 7"  (1.702 m)   Wt 164 lb 12.8 oz (74.8 kg)   SpO2 98%   BMI 25.81 kg/m   General appearance: alert, well developed, well nourished HEENT: normocephalic, sclerae anicteric, conjunctiva pink and moist, TMs pearly, nares patent, no discharge or erythema, pharynx normal Oral cavity: MMM, no lesions Neck: supple, no lymphadenopathy, no thyromegaly, no masses, no bruits Heart: RRR, normal S1, S2, no murmurs Pulses: 2+ radial pulses, 2+ pedal pulses, normal cap refill Ext: no edema Neuro: CN2-12 intact, nonfocal exam, A&Ox3    Assessment: Encounter Diagnoses  Name Primary?  . Other migraine with status migrainosus, intractable Yes  . Nausea and vomiting, intractability of vomiting not specified, unspecified vomiting type   . Intractable menstrual migraine with status migrainosus      Plan: Discussed symptoms, concerns, prior therapies.  No red flag symptoms or findings.  IM promethazine and Toradol given  today for abortive therapy.   prescribed Promethazine and Norco as back up therapy in the event she needs this the next few days at home for lingering symptoms.    C/t Topamax for migraine prevention, c/t Frova going forward for abortive therapy.   Rest, hydrate well, avoid triggers when possible.   F/u with Dr. Redmond Hunter next week for discussed of preventative therapy, possible increase in Topamax dose.  Return/recheck sooner otherwise prn.  Her friend is driving her home today.  Marilyn Hunter was seen today for migraine.  Diagnoses and all orders for this visit:  Other migraine with status migrainosus,  intractable -     promethazine (PHENERGAN) injection 25 mg -     ketorolac (TORADOL) injection 60 mg  Nausea and vomiting, intractability of vomiting not specified, unspecified vomiting type  Intractable menstrual migraine with status migrainosus  Other orders -     HYDROcodone-acetaminophen (NORCO) 5-325 MG tablet; Take 1 tablet by mouth every 6 (six) hours as needed. -     promethazine (PHENERGAN) 25 MG tablet; Take 1 tablet (25 mg total) by mouth every 8 (eight) hours as needed for nausea or vomiting.    Follow up: next week with Dr. Redmond Hunter

## 2019-11-06 ENCOUNTER — Ambulatory Visit: Payer: BC Managed Care – PPO | Admitting: Medical

## 2019-11-06 ENCOUNTER — Encounter: Payer: Self-pay | Admitting: Medical

## 2019-11-06 ENCOUNTER — Other Ambulatory Visit: Payer: Self-pay

## 2019-11-06 VITALS — BP 110/70 | HR 67 | Temp 98.7°F | Ht 67.0 in | Wt 166.0 lb

## 2019-11-06 DIAGNOSIS — G43831 Menstrual migraine, intractable, with status migrainosus: Secondary | ICD-10-CM

## 2019-11-06 MED ORDER — PROMETHAZINE HCL 25 MG RE SUPP: 25.0000 mg | Freq: Four times a day (QID) | RECTAL | 0 refills | Status: DC | PRN

## 2019-11-06 MED ORDER — METHYLPREDNISOLONE ACETATE 80 MG/ML IJ SUSP
80.0000 mg | Freq: Once | INTRAMUSCULAR | Status: AC
  Administered 2019-11-06: 80 mg via INTRAMUSCULAR

## 2019-11-06 MED ORDER — PROMETHAZINE HCL 25 MG/ML IJ SOLN
25.0000 mg | Freq: Once | INTRAMUSCULAR | Status: AC
  Administered 2019-11-06: 25 mg via INTRAMUSCULAR

## 2019-11-06 NOTE — Progress Notes (Signed)
Subjective:  Marilyn Hunter is a 29 y.o. female who presents for Chief Complaint  Patient presents with  . Migraine     Here for migraine.   This is a f/u from visit 2 days ago for same.  She note very little improvement from injection of Toradol and promethazine 2 days ago.   She tried using promethazine yesterday but still had vomiting.  Can't keep the promethazine down.   Tried some aleve, can't keep this down.   No new or worse symptoms.  Just unchanged.  She was afraid to use the norco.      She has hx/o migraines starting years ago, hx/o menstrual migraines.   Migraines improved when she was pregnant.   However, since she had her baby and now back on IUD, she has started back getting migraines.   She just saw Marilyn Hunter her PCP here  Back about a month ago to restart her old regimen of Topamax for prevention and Frova for abortive therapy.   However, she is still getting quite a few migraines per month.  She currently has migraine that started 5 days ago, not improved with several doses of Frova.  Unilateral, throbbing, with nausea and vomiting which is her typical presentation.   She has no unusual symptoms.   Her period is about to start and this is similar to the way her migraines normally start.   She hasn't done well on amitriptyline in the past.  No other prior therapies.   She has had several episodes of nausea and vomiting today.  No other aggravating or relieving factors. No other complaint.  The following portions of the patient's history were reviewed and updated as appropriate: allergies, current medications, past family history, past medical history, past social history, past surgical history and problem list.  ROS Otherwise as in subjective above  Past Medical History:  Diagnosis Date  . Abnormal Pap smear 2006   ASCUS, pos HR HPV  . Allergic rhinitis, cause unspecified   . Common migraine with intractable migraine 04/03/2017  . Migraines    chronic  . Ovarian cyst   .  Vaginal Pap smear, abnormal     Current Outpatient Medications on File Prior to Visit  Medication Sig Dispense Refill  . acetaminophen (TYLENOL) 500 MG tablet Take 500 mg by mouth every 6 (six) hours as needed.    . frovatriptan (FROVA) 2.5 MG tablet Take 1 tablet (2.5 mg total) by mouth as needed for migraine. If recurs, may repeat after 2 hours. Max of 3 tabs in 24 hours. 10 tablet 1  . HYDROcodone-acetaminophen (NORCO) 5-325 MG tablet Take 1 tablet by mouth every 6 (six) hours as needed. 10 tablet 0  . promethazine (PHENERGAN) 25 MG tablet Take 1 tablet (25 mg total) by mouth every 8 (eight) hours as needed for nausea or vomiting. 10 tablet 0  . topiramate (TOPAMAX) 25 MG tablet Take 1 tablet (25 mg total) by mouth 2 (two) times daily. 60 tablet 1   No current facility-administered medications on file prior to visit.     Objective: BP 110/70   Pulse 67   Temp 98.7 F (37.1 C)   Ht 5\' 7"  (1.702 m)   Wt 166 lb (75.3 kg)   SpO2 99%   BMI 26.00 kg/m   General appearance: alert, well developed, well nourished HEENT: normocephalic, sclerae anicteric, conjunctiva pink and moist, TMs pearly, nares patent, no discharge or erythema, pharynx normal Oral cavity: MMM, no lesions Neck: supple,  no lymphadenopathy, no thyromegaly, no masses, no bruits Heart: RRR, normal S1, S2, no murmurs Pulses: 2+ radial pulses, 2+ pedal pulses, normal cap refill Ext: no edema Neuro: CN2-12 intact, nonfocal exam, A&Ox3    Assessment: Encounter Diagnosis  Name Primary?  . Intractable menstrual migraine with status migrainosus Yes     Plan: Discussed symptoms, concerns, prior therapies.  No red flag symptoms or findings.  IM promethazine 25mg  and 80mg  DepoMedrol given today for abortive therapy.   prescribed Promethazine suppository today since she is having a hard time with holding oral intake down.   Reassured that if needed can use the Norco as back up therapy in the event she needs this the next  few days at home for lingering symptoms.   C/t Topamax for migraine prevention, c/t Frova going forward for abortive therapy.   Rest, hydrate well, avoid triggers when possible.   F/u with Dr. next week for discussed of preventative therapy, possible increase in Topamax dose.  Return/recheck sooner otherwise prn. If worse over weekend, go to the ED.  Marilyn Hunter was seen today for migraine.  Diagnoses and all orders for this visit:  Intractable menstrual migraine with status migrainosus -     methylPREDNISolone acetate (DEPO-MEDROL) injection 80 mg -     promethazine (PHENERGAN) injection 25 mg  Other orders -     promethazine (PHENERGAN) 25 MG suppository; Place 1 suppository (25 mg total) rectally every 6 (six) hours as needed for nausea or vomiting.    Follow up: next week with Marilyn Hunter

## 2019-11-10 ENCOUNTER — Telehealth: Payer: Self-pay | Admitting: Medical

## 2019-11-10 NOTE — Telephone Encounter (Signed)
Good to hear, have her f/u with Dr. Redmond School on migraines

## 2019-11-10 NOTE — Telephone Encounter (Signed)
Call and see if the migraine finally resolved over the weekend?

## 2019-11-10 NOTE — Telephone Encounter (Signed)
Patient stated they got better. Hasn't gone away but has definitely gotten better.

## 2019-11-17 ENCOUNTER — Institutional Professional Consult (permissible substitution): Payer: BC Managed Care – PPO | Admitting: Family Medicine

## 2019-11-19 ENCOUNTER — Encounter: Payer: Self-pay | Admitting: Family Medicine

## 2019-11-25 ENCOUNTER — Other Ambulatory Visit: Payer: Self-pay

## 2019-11-25 ENCOUNTER — Ambulatory Visit: Payer: BC Managed Care – PPO | Admitting: Medical

## 2019-11-25 VITALS — BP 120/70 | HR 86 | Temp 97.7°F | Wt 165.0 lb

## 2019-11-25 DIAGNOSIS — G43829 Menstrual migraine, not intractable, without status migrainosus: Secondary | ICD-10-CM

## 2019-11-25 DIAGNOSIS — G4700 Insomnia, unspecified: Secondary | ICD-10-CM | POA: Diagnosis not present

## 2019-11-25 DIAGNOSIS — R519 Headache, unspecified: Secondary | ICD-10-CM | POA: Diagnosis not present

## 2019-11-25 DIAGNOSIS — Z566 Other physical and mental strain related to work: Secondary | ICD-10-CM

## 2019-11-25 MED ORDER — PROMETHAZINE HCL 50 MG/ML IJ SOLN
25.0000 mg | Freq: Once | INTRAMUSCULAR | Status: AC
  Administered 2019-11-25: 16:00:00 25 mg via INTRAMUSCULAR

## 2019-11-25 MED ORDER — KETOROLAC TROMETHAMINE 60 MG/2ML IM SOLN
60.0000 mg | Freq: Once | INTRAMUSCULAR | Status: AC
  Administered 2019-11-25: 60 mg via INTRAMUSCULAR

## 2019-11-25 MED ORDER — FROVATRIPTAN SUCCINATE 2.5 MG PO TABS: 2.5000 mg | ORAL_TABLET | ORAL | 1 refills | Status: DC | PRN

## 2019-11-25 MED ORDER — AIMOVIG 70 MG/ML ~~LOC~~ SOAJ: 70.0000 mg | SUBCUTANEOUS | 0 refills | Status: DC

## 2019-11-25 MED ORDER — PROMETHAZINE HCL 25 MG RE SUPP: 25.0000 mg | Freq: Four times a day (QID) | RECTAL | 0 refills | Status: DC | PRN

## 2019-11-25 MED ORDER — LORAZEPAM 0.5 MG PO TABS: 0.5000 mg | ORAL_TABLET | Freq: Every evening | ORAL | 0 refills | Status: DC | PRN

## 2019-11-25 NOTE — Progress Notes (Signed)
Subjective:  Marilyn Hunter is a 29 y.o. female who presents for Chief Complaint  Patient presents with  . migraines    migraines x 4 days. vomitting since morning due to nausea     Here for migraine.   I saw her on 11/4 and 11/6 for same.   She saw Dr. Redmond School on 10/08/19 to get back on preventative therapy for recent increase in migraines.   Thus, this makes at least 4 visits now within a little over a month for migraines.    At her last visit we ended up doing IM Toradol and promethazine for acute migraine therapy.    She was suppose to follow up with Dr. Redmond School her PCP on 11/17/19 visit but did not come in for that visit.    LMP was end of October/early november.   Thinks she is getting ready to start periods which is a migraine trigger.  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  Feels like lack of sleep, stress, and hormones are all contributing to the headaches.     Doesn't drink alcohol.   Tries to proactive good hygiene.   Not a night owl. Goes to be relatively early.  She has hx/o migraines starting years ago, hx/o menstrual migraines.   Migraines improved when she was pregnant.   However, since she had her baby and now back on IUD, she has started back getting migraines.   She just saw Dr. Redmond School her PCP here back about a month ago to restart her old regimen of Topamax for prevention and Frova for abortive therapy.   She hasn't done well on amitriptyline in the past.  No other prior therapies.   Headaches typically unilateral, throbbing, with nausea and vomiting which is her typical presentation.   She has no unusual symptoms.     Currently working as a Engineer, petroleum.  Has a 82-monthold.  Married.  Her marriage is fine, her child is doing well.  Not breast-feeding.  She has an IUD.  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  She notes that her sleep issues have started within  the past 4 years.  She can get to sleep just fine but often wakes up a few hours later.  Some nights only gets 3 hours per sleep. has a very difficult time getting back to sleep at all.  Does not drink alcohol.  No other aggravating or relieving factors. No other complaint.   The following portions of the patient's history were reviewed and updated as appropriate: allergies, current medications, past family history, past medical history, past social history, past surgical history and problem list.  ROS Otherwise as in subjective above  Past Medical History:  Diagnosis Date  . Abnormal Pap smear 2006   ASCUS, pos HR HPV  . Allergic rhinitis, cause unspecified   . Common migraine with intractable migraine 04/03/2017  . Migraines    chronic  . Ovarian cyst   . Vaginal Pap smear, abnormal     Current Outpatient Medications on File Prior to Visit  Medication Sig Dispense Refill  . acetaminophen (TYLENOL) 500 MG tablet Take 500 mg by mouth every 6 (six) hours as needed.    .Marland KitchenHYDROcodone-acetaminophen (NORCO) 5-325 MG tablet Take 1 tablet by mouth every 6 (six) hours as needed. (Patient not taking: Reported on 11/25/2019) 10 tablet 0  . promethazine (  PHENERGAN) 25 MG tablet Take 1 tablet (25 mg total) by mouth every 8 (eight) hours as needed for nausea or vomiting. (Patient not taking: Reported on 11/25/2019) 10 tablet 0   No current facility-administered medications on file prior to visit.    Family History  Problem Relation Age of Onset  . Hodgkin's lymphoma Mother 43  . Thyroid cancer Mother        (from radiation/hodgkin's treatment)  . Breast cancer Mother 73       possibly related to radiation treatment for hodgkins  BRCA test is negative  . Cancer Mother        hodgkin's; thyroid; breast; now new tumor in thoracic spine from possible breast cancer.  . Cervical cancer Maternal Aunt 53       stage III - clinical trial - BRCA negative  . Hyperlipidemia Maternal Grandfather   .  Hypertension Maternal Grandmother   . Autism Cousin   . Diabetes Neg Hx   . Heart disease Neg Hx    Past Surgical History:  Procedure Laterality Date  . FOOT SURGERY  L 01/2012; R 02/2012   bilateral taylor bunionectomies, and similar to regular bunionectomy surgeries (1st and 5th metatarsals bilaterally)  . TONSILLECTOMY  age 66  . TONSILLECTOMY       Objective: BP 120/70   Pulse 86   Temp 97.7 F (36.5 C)   Wt 165 lb (74.8 kg)   BMI 25.84 kg/m   Wt Readings from Last 3 Encounters:  11/25/19 165 lb (74.8 kg)  11/06/19 166 lb (75.3 kg)  11/04/19 164 lb 12.8 oz (74.8 kg)   BP Readings from Last 3 Encounters:  11/25/19 120/70  11/06/19 110/70  11/04/19 114/76    General appearance: alert, well developed, well nourished Oral cavity: MMM, no lesions Neck: supple, no lymphadenopathy, no thyromegaly, no masses, no bruits Heart: RRR, normal S1, S2, no murmurs Pulses: 2+ radial pulses, 2+ pedal pulses, normal cap refill Ext: no edema Neuro: CN2-12 intact, nonfocal exam, A&Ox3    Assessment: Encounter Diagnoses  Name Primary?  . Menstrual migraine without status migrainosus, not intractable Yes  . Insomnia, unspecified type   . Work stress   . Frequent headaches      Plan: Migraine triggers seem to be menstrual periods, stress, poor sleep  We discussed her migraines, her sleep issues.  We discussed ways to deal with stress.  We discussed sleep hygiene.  Consider counseling.  Consider sleep study.  Begin short-term trial of Ativan to help with sleep.  We discussed journaling or other ways to slow mind down at bedtime to get her thoughts to calm down so she can enjoy the night of rest.  If this helps consider other appropriate sleep strategies including CBT, trazodone or other safer regular use medicines  Gave 60 mg Toradol and 25 mg Phenergan IM today given her intractable nausea vomiting and migraine x4 days.  She will wean off Topamax by using 1 tablet daily for 1  week then stopping.  She will begin trial of Aimovig.  Discussed proper use of medication, discussed risk and benefits of medication.  c/t Frova and promethazine for home use prn going forward for abortive therapy.   Rest, hydrate well, avoid triggers when possible.     Mayzee was seen today for migraines.  Diagnoses and all orders for this visit:  Menstrual migraine without status migrainosus, not intractable -     frovatriptan (FROVA) 2.5 MG tablet; Take 1 tablet (2.5 mg total)  by mouth as needed for migraine. If recurs, may repeat after 2 hours. Max of 3 tabs in 24 hours. -     promethazine (PHENERGAN) injection 25 mg -     ketorolac (TORADOL) injection 60 mg  Insomnia, unspecified type  Work stress  Frequent headaches -     promethazine (PHENERGAN) injection 25 mg -     ketorolac (TORADOL) injection 60 mg  Other orders -     LORazepam (ATIVAN) 0.5 MG tablet; Take 1 tablet (0.5 mg total) by mouth at bedtime as needed for anxiety. -     promethazine (PHENERGAN) 25 MG suppository; Place 1 suppository (25 mg total) rectally every 6 (six) hours as needed for nausea or vomiting. -     Erenumab-aooe (AIMOVIG) 70 MG/ML SOAJ; Inject 70 mg into the skin every 30 (thirty) days.    Follow up: within 2-3 weeks with Dr. Redmond School

## 2019-12-07 ENCOUNTER — Encounter: Payer: Self-pay | Admitting: Medical

## 2019-12-07 ENCOUNTER — Other Ambulatory Visit: Payer: Self-pay

## 2019-12-07 ENCOUNTER — Ambulatory Visit: Payer: BC Managed Care – PPO | Admitting: Family Medicine

## 2019-12-07 VITALS — BP 120/68 | HR 82 | Temp 98.3°F | Ht 67.0 in | Wt 171.4 lb

## 2019-12-07 DIAGNOSIS — G43831 Menstrual migraine, intractable, with status migrainosus: Secondary | ICD-10-CM | POA: Diagnosis not present

## 2019-12-07 NOTE — Progress Notes (Signed)
   Subjective:    Patient ID: Marilyn Hunter, female    DOB: [birth year], 29 y.o.   MRN: [id number]  HPI She is here for evaluation and treatment of continued difficulty with headache.  She has a long history of difficulty with migraine headaches.  In the past she had done well on Topamax as well as Frova.  She has been in her several times for this.  Presently she is on Topamax 50 mg daily.  In the past she had been given other pain meds and at 1 point a steroid injection all of which without good benefit.  The headaches are usually throbbing and right-sided.  She states it has never gone away completely but does tend to wax and wane on a daily basis and sometimes more often than that.  It is interfering with her sleep.   Review of Systems     Objective:   Physical Exam Alert and in no distress otherwise not examined       Assessment & Plan:  Intractable menstrual migraine with status migrainosus Discussed options with her.  Recommend that she increase her Topamax to 100 mg daily, take the Aimovig that she was given on one of her last visits.  Discussed her care with Donette Larry Sagan to see if she qualifies for a research study which she does not.  We will consider switching her to Depakote, possibly getting a MRI and referral to headache specialist if she does not make any progress.  She was comfortable with that.

## 2019-12-22 ENCOUNTER — Other Ambulatory Visit: Payer: Self-pay | Admitting: Family Medicine

## 2019-12-22 DIAGNOSIS — G43829 Menstrual migraine, not intractable, without status migrainosus: Secondary | ICD-10-CM

## 2020-01-04 ENCOUNTER — Telehealth: Payer: Self-pay

## 2020-01-04 MED ORDER — AIMOVIG 70 MG/ML ~~LOC~~ SOAJ: 70.0000 mg | SUBCUTANEOUS | 3 refills | Status: DC

## 2020-01-04 NOTE — Telephone Encounter (Signed)
Advised pt and a PA was sent over by pharmacy. Marilyn Hunter

## 2020-01-04 NOTE — Telephone Encounter (Signed)
Let her know that I called in a 57-month supply and if there is an issue with cost, let us know

## 2020-01-04 NOTE — Telephone Encounter (Signed)
Pt. Called to let you know that the sample you gave her was working ok Aimovig, will need a prescription for it called in and wasn't sure what she needed to do from here or what the plan is now.

## 2020-01-11 ENCOUNTER — Telehealth: Payer: Self-pay

## 2020-01-11 NOTE — Telephone Encounter (Signed)
I have submitted a PA for the pts. Aimovig and it was approved from 01/06/20-04/04/20

## 2020-01-13 ENCOUNTER — Other Ambulatory Visit: Payer: Self-pay

## 2020-01-13 ENCOUNTER — Ambulatory Visit: Payer: BC Managed Care – PPO | Admitting: Family Medicine

## 2020-01-13 VITALS — BP 116/80 | HR 94 | Temp 97.8°F

## 2020-01-13 DIAGNOSIS — G43901 Migraine, unspecified, not intractable, with status migrainosus: Secondary | ICD-10-CM | POA: Diagnosis not present

## 2020-01-13 MED ORDER — ONDANSETRON 8 MG PO TBDP: 8.0000 mg | ORAL_TABLET | Freq: Three times a day (TID) | ORAL | 1 refills | Status: DC | PRN

## 2020-01-13 MED ORDER — ONDANSETRON 4 MG PO TBDP
4.0000 mg | ORAL_TABLET | Freq: Once | ORAL | Status: AC
  Administered 2020-01-13: 4 mg via ORAL

## 2020-01-13 MED ORDER — KETOROLAC TROMETHAMINE 60 MG/2ML IM SOLN
60.0000 mg | Freq: Once | INTRAMUSCULAR | Status: AC
  Administered 2020-01-13: 60 mg via INTRAMUSCULAR

## 2020-01-13 NOTE — Progress Notes (Signed)
   Subjective:    Patient ID: Marilyn Hunter, female    DOB: August 08, 1990, 30 y.o.   MRN: [id number]  HPI She is here for treatment of migraine headache.  She started having a headache several days ago and has been taking Frova.  She took 2 doses on Saturday with some relief of her symptoms again on Sunday.  She has been able to work but has been having difficulty with nausea and also recent vomiting.  The headaches are right-sided, throbbing with photo and phonophobia.  She has been cleared to take Aimovig which she took yesterday.   Review of Systems     Objective:   Physical Exam Alert and complaining of headache.  The room was kept dark for her comfort.       Assessment & Plan:  Migraine with status migrainosus, not intractable, unspecified migraine type - Plan: ondansetron (ZOFRAN ODT) 8 MG disintegrating tablet I will give her Zofran and Toradol.  Hopefully this will allow her to take her follow-up with.  Discussed possibly placing her on a steroid Dosepak if she does not break.  She will call me tomorrow if we need to do this.  She was comfortable with that approach.

## 2020-01-13 NOTE — Addendum Note (Signed)
Addended by: Renelda Loma on: 01/13/2020 04:26 PM   Modules accepted: Orders

## 2020-01-14 ENCOUNTER — Telehealth: Payer: Self-pay | Admitting: Family Medicine

## 2020-01-14 MED ORDER — PREDNISONE 10 MG (48) PO TBPK: ORAL_TABLET | ORAL | 0 refills | Status: DC

## 2020-01-14 NOTE — Telephone Encounter (Signed)
Let her know that I called it in 

## 2020-01-14 NOTE — Telephone Encounter (Signed)
Pt called and states that she is still having a bad migrianes and has been throwing up and is wanting to know if she can get a steroid pack called in and pt uses [id number] - , [place] - [address] AT [address] OF [facility] [address]

## 2020-01-14 NOTE — Telephone Encounter (Signed)
LVM advising of med being called in. [facility]

## 2020-01-25 ENCOUNTER — Ambulatory Visit: Payer: BC Managed Care – PPO | Admitting: Family Medicine

## 2020-01-25 ENCOUNTER — Other Ambulatory Visit: Payer: Self-pay

## 2020-01-25 ENCOUNTER — Encounter: Payer: Self-pay | Admitting: Family Medicine

## 2020-01-25 VITALS — BP 118/80 | HR 87 | Temp 98.2°F | Wt 172.8 lb

## 2020-01-25 DIAGNOSIS — G43111 Migraine with aura, intractable, with status migrainosus: Secondary | ICD-10-CM

## 2020-01-25 DIAGNOSIS — G4459 Other complicated headache syndrome: Secondary | ICD-10-CM

## 2020-01-25 NOTE — Progress Notes (Signed)
   Subjective:    Patient ID: Marilyn Hunter, female    DOB: January 31, 1990, 30 y.o.   MRN: [id number]  HPI She is here for consult concerning continued difficulty with migraine headache.  She has been seen on several times for treatment of her migraine over the last several months.  She describes the pain is behind her right eye with nausea, vomiting, photo and phonophobia.  She does use Frova but this has not been useful.  She was recently started on Aimovig and the headaches have continued.  She was given a steroid Dosepak in mid January which again did not help.  She now notes that she has the pain behind the right eye as previously described and also getting a numb feeling down the posterior neck area also notes a slight numbness of her tongue.   Review of Systems     Objective:   Physical Exam Alert and complaining of headache.  EOMI.  Other cranial nerves grossly intact.  DTRs normal.  Cerebellar normal.       Assessment & Plan:  Intractable migraine with aura with status migrainosus - Plan: Ambulatory referral to Neurology, MR Brain W Wo Contrast  Other complicated headache syndrome A sample of Ubrelvy 100 mg given with instructions on using it again after 2 hours.  Side effects of the medicine also discussed since this seems to be intractable, I will refer to neurology for further evaluation and treatment.

## 2020-01-29 ENCOUNTER — Encounter: Payer: Self-pay | Admitting: Family Medicine

## 2020-01-30 MED ORDER — UBRELVY 100 MG PO TABS: 1.0000 | ORAL_TABLET | ORAL | 1 refills | Status: DC

## 2020-02-04 ENCOUNTER — Telehealth: Payer: Self-pay

## 2020-02-04 NOTE — Telephone Encounter (Signed)
I submitted a PA for the pts. Marilyn Hunter and it was approved from 02/02/20-04/25/20.

## 2020-02-11 ENCOUNTER — Ambulatory Visit
Admission: RE | Admit: 2020-02-11 | Discharge: 2020-02-11 | Disposition: A | Discharge status: Home / Self Care | Payer: BC Managed Care – PPO | Source: Ambulatory Visit | Attending: Family Medicine | Admitting: Family Medicine

## 2020-02-11 ENCOUNTER — Other Ambulatory Visit: Payer: Self-pay

## 2020-02-11 DIAGNOSIS — R519 Headache, unspecified: Secondary | ICD-10-CM | POA: Diagnosis not present

## 2020-02-11 MED ORDER — GADOBENATE DIMEGLUMINE 529 MG/ML IV SOLN
15.0000 mL | Freq: Once | INTRAVENOUS | Status: AC | PRN
  Administered 2020-02-11: 15 mL via INTRAVENOUS

## 2020-02-23 ENCOUNTER — Ambulatory Visit: Payer: BC Managed Care – PPO | Admitting: Family Medicine

## 2020-02-23 ENCOUNTER — Other Ambulatory Visit: Payer: Self-pay

## 2020-02-23 ENCOUNTER — Encounter: Payer: Self-pay | Admitting: Family Medicine

## 2020-02-23 VITALS — BP 134/82 | HR 109 | Temp 97.8°F | Wt 171.0 lb

## 2020-02-23 DIAGNOSIS — M940 Chondrocostal junction syndrome [Tietze]: Secondary | ICD-10-CM

## 2020-02-23 NOTE — Progress Notes (Signed)
   Subjective:    Patient ID: Marilyn Hunter, female    DOB: Mar 26, 1990, 30 y.o.   MRN: [id number]  HPI She has a 2-day history of difficulty with dull pressure sensation in the left chest area that is pleuritic in nature.  When she puts her left arm over her head, she does feel the pressure in the left chest area.  No fever, chills, cough or congestion.  No recent injury. She does have difficulty with migraine headaches and presently is on Aimovig and is now using [place].  Statins did not seem to be very effective.   Review of Systems     Objective:   Physical Exam Alert and in no distress.  Tender to palpation is noted over the left second costochondral junction.  Cardiac exam shows regular sinus rhythm without murmurs or gallops.  Lungs are clear to auscultation.       Assessment & Plan:  Costochondritis I reassured her that there was nothing cardiac going on and this was musculoskeletal in nature.  Recommended NSAID of choice for this.  She was comfortable with that.

## 2020-02-24 NOTE — Progress Notes (Signed)
Virtual Visit via Video Note The purpose of this virtual visit is to provide medical care while limiting exposure to the novel coronavirus.    Consent was obtained for video visit:  Yes.   Answered questions that patient had about telehealth interaction:  Yes.   I discussed the limitations, risks, security and privacy concerns of performing an evaluation and management service by telemedicine. I also discussed with the patient that there may be a patient responsible charge related to this service. The patient expressed understanding and agreed to proceed.  Pt location: Home Physician Location: office Name of referring provider:  Denita Lung, MD I connected with Prentiss Bells at patients initiation/request on 02/26/2020 at 11:10 AM EST by video enabled telemedicine application and verified that I am speaking with the correct person using two identifiers. Pt MRN:  [id number] Pt DOB:  08-12-1990 Video Participants:  Prentiss Bells   History of Present Illness:  Cing Marilyn Hunter is a 30 year old right-handed female who presents for migraines.  History supplemented by prior neurologist and referring provider notes.  She has had migraines in her early 42s.  They are typically severe pulsating pain behind the right eye but has diffuse head pressure.  Associated with  Spots in her visual field, sensation of whole-body pressure, dizziness, photophobia, phonophobia, osmophobia, nausea, vomiting, .Once she starts vomiting, she cannot stop.  Triggers include cheese, almonds, alcohol, cigarette smoke, strong perfumes and menstrual cycle.  Relieving factors include quiet environment, ice pack, apply pressure on eye.  They have been uncontrolled and chronic for many years.  In 2017, she started getting ovarian cysts and had multiple menstrual cycles a month.  This led to increased migraine frequency.  She was going to be evaluated for endometriosis but then became pregnant.  She had her son in January 2019  and breast-fed for about a year.  At that time, she would have a migraine about once a month.  This past summer 2020, they have become worse.  In January 2021, she then started having a new associated symptom, sensation of right sided numbness behind her right eye and down right side of her throat.  They have been lasting 2 weeks with maybe 2 days of headache freedom in between attacks.  MRI of brain with and without contrast on 02/11/2020 was personally reviewed and was normal.  Current NSAIDS:  none Current analgesics:  none Current triptans:  none Current ergotamine:  none Current anti-emetic:  Promethazine 65m PO and PR; Zofran ODT 898mCurrent muscle relaxants:  none Current anti-anxiolytic:  none Current sleep aide:  none Current Antihypertensive medications:  none Current Antidepressant medications:  none Current Anticonvulsant medications:  none Current anti-CGRP:  Aimovig 7030monthly (2 months, 3rd shot this morning); Ubrelvy 100m6meadache resolves in 2 hours but returns in around 8 hours) Current Vitamins/Herbal/Supplements:  none Current Antihistamines/Decongestants:  none Other therapy:  none Hormone/birth control:  none  Past NSAIDS/steroids:  Ibuprofen 800mg74mproxen; diclofenac tablet; ketorolac 10mg 55met; prednisone Past analgesics:  Excedrin Migraine; tramadol; Tylenol Past abortive triptans:  Frova 2.5mg; M90mlt; Relpax; sumatriptan po/NS Past abortive ergotamine:  none Past muscle relaxants:  Robaxin Past anti-emetic:  none Past antihypertensive medications:  none Past antidepressant medications:  Amitriptyline; nortriptyline Past anticonvulsant medications:  topiramate Past anti-CGRP:  none Past vitamins/Herbal/Supplements:  none Past antihistamines/decongestants:  none Other past therapies:  none  Caffeine:  2 cups of coffee daily.  No soda Alcohol:  no Smoker:  no Diet:  At least 60  oz water daily.  No soda.  Does not skip meals. Exercise:  Not  routine Depression:  no; Anxiety:  Sometimes feels overwhelmed. Other pain:  no Sleep hygiene:  Poor.  May get 2-3 hours when the migraine is severe. Family history of headache:  Maternal aunt (migraines); maternal grandmother (migraines)  Past Medical History: Past Medical History:  Diagnosis Date  . Abnormal Pap smear 2006   ASCUS, pos HR HPV  . Allergic rhinitis, cause unspecified   . Common migraine with intractable migraine 04/03/2017  . Migraines    chronic  . Ovarian cyst   . Vaginal Pap smear, abnormal     Medications: Outpatient Encounter Medications as of 02/26/2020  Medication Sig  . acetaminophen (TYLENOL) 500 MG tablet Take 500 mg by mouth every 6 (six) hours as needed.  Eduard Roux (AIMOVIG) 70 MG/ML SOAJ Inject 70 mg into the skin every 30 (thirty) days.  . frovatriptan (FROVA) 2.5 MG tablet Take 1 tablet (2.5 mg total) by mouth as needed for migraine. If recurs, may repeat after 2 hours. Max of 3 tabs in 24 hours. (Patient not taking: Reported on 02/23/2020)  . HYDROcodone-acetaminophen (NORCO) 5-325 MG tablet Take 1 tablet by mouth every 6 (six) hours as needed. (Patient not taking: Reported on 11/25/2019)  . LORazepam (ATIVAN) 0.5 MG tablet Take 1 tablet (0.5 mg total) by mouth at bedtime as needed for anxiety. (Patient not taking: Reported on 01/13/2020)  . ondansetron (ZOFRAN ODT) 8 MG disintegrating tablet Take 1 tablet (8 mg total) by mouth every 8 (eight) hours as needed for nausea or vomiting.  . predniSONE (STERAPRED UNI-PAK 48 TAB) 10 MG (48) TBPK tablet Take as per manufacturer's recommendation. (Patient not taking: Reported on 02/23/2020)  . promethazine (PHENERGAN) 25 MG suppository Place 1 suppository (25 mg total) rectally every 6 (six) hours as needed for nausea or vomiting.  . promethazine (PHENERGAN) 25 MG tablet Take 25 mg by mouth every 6 (six) hours as needed for nausea or vomiting.  Marland Kitchen Ubrogepant (UBRELVY) 100 MG TABS Take 1 tablet by mouth as  directed. When you get an aura and may repeat in 2 hours   No facility-administered encounter medications on file as of 02/26/2020.    Allergies: No Known Allergies  Family History: Family History  Problem Relation Age of Onset  . Hodgkin's lymphoma Mother 12  . Thyroid cancer Mother        (from radiation/hodgkin's treatment)  . Breast cancer Mother 70       possibly related to radiation treatment for hodgkins  BRCA test is negative  . Cancer Mother        hodgkin's; thyroid; breast; now new tumor in thoracic spine from possible breast cancer.  . Cervical cancer Maternal Aunt 53       stage III - clinical trial - BRCA negative  . Hyperlipidemia Maternal Grandfather   . Hypertension Maternal Grandmother   . Autism Cousin   . Diabetes Neg Hx   . Heart disease Neg Hx     Social History: Social History   Socioeconomic History  . Marital status: Married    Spouse name: Not on file  . Number of children: 0  . Years of education: Doctorate  . Highest education level: Not on file  Occupational History  . Occupation: Insurance underwriter: [employer]  Tobacco Use  . Smoking status: Never Smoker  . Smokeless tobacco: Never Used  Substance and Sexual Activity  . Alcohol use:  Yes  . Drug use: No  . Sexual activity: Yes    Partners: Male    Birth control/protection: Condom    Comment: paragard inserted 05/11/15, removed 11/2016  Other Topics Concern  . Not on file  Social History Narrative   Works as a Chief Executive Officer, passed bar exam in 2017. Lives with her husband.   Right-handed   Caffeine: 2-3 cups of coffee per day   Social Determinants of Health   Financial Resource Strain:   . Difficulty of Paying Living Expenses: Not on file  Food Insecurity:   . Worried About Charity fundraiser in the Last Year: Not on file  . Ran Out of Food in the Last Year: Not on file  Transportation Needs:   . Lack of Transportation (Medical): Not on file  . Lack of Transportation  (Non-Medical): Not on file  Physical Activity:   . Days of Exercise per Week: Not on file  . Minutes of Exercise per Session: Not on file  Stress:   . Feeling of Stress : Not on file  Social Connections:   . Frequency of Communication with Friends and Family: Not on file  . Frequency of Social Gatherings with Friends and Family: Not on file  . Attends Religious Services: Not on file  . Active Member of Clubs or Organizations: Not on file  . Attends Archivist Meetings: Not on file  . Marital Status: Not on file  Intimate Partner Violence:   . Fear of Current or Ex-Partner: Not on file  . Emotionally Abused: Not on file  . Physically Abused: Not on file  . Sexually Abused: Not on file    Observations/Objective:   Height _0  (1.702 m), weight 165 lb (74.8 kg), unknown if currently breastfeeding. No acute distress.  Alert and oriented.  Speech fluent and not dysarthric.  Language intact.  Eyes orthophoric on primary gaze.  Face symmetric.  Assessment and Plan:   Chronic migraine without aura, with status migrainosus, intractable. Migraine with aura, with status migrainosus, intractable  1.  For preventative management, she will continue Aimovig 6m monthly.  I told her that I like people on it for 4 months before deciding if a change is warranted by because studies have shown subsequent doses become more effective.   2.  For abortive therapy, continue Ubrelvy.  It is effective.  However, since she has a persistent intractable headache, the headache eventually returns.  Until we get the migraines under better control, there will be no analgesic at this time to completely abort the headache. 3.  Limit use of pain relievers to no more than 2 days out of week to prevent risk of rebound or medication-overuse headache. 4.  Keep headache diary 5.  Exercise, hydration, caffeine cessation, sleep hygiene, monitor for and avoid triggers 6.  Consider:  magnesium citrate 4051mdaily,  riboflavin 400109maily, and coenzyme Q10 100m5mree times daily 7. Always keep in mind that currently taking a hormone or birth control may be a possible trigger or aggravating factor for migraine. 8. Follow up 2 months.   Follow Up Instructions:    -I discussed the assessment and treatment plan with the patient. The patient was provided an opportunity to ask questions and all were answered. The patient agreed with the plan and demonstrated an understanding of the instructions.   The patient was advised to call back or seek an in-person evaluation if the symptoms worsen or if the condition fails to improve as  anticipated.    Dudley Major, DO

## 2020-02-26 ENCOUNTER — Encounter: Payer: Self-pay | Admitting: Neurology

## 2020-02-26 ENCOUNTER — Other Ambulatory Visit: Payer: Self-pay

## 2020-02-26 ENCOUNTER — Telehealth (INDEPENDENT_AMBULATORY_CARE_PROVIDER_SITE_OTHER): Payer: BC Managed Care – PPO | Admitting: Neurology

## 2020-02-26 VITALS — Ht 67.0 in | Wt 165.0 lb

## 2020-02-26 DIAGNOSIS — G43111 Migraine with aura, intractable, with status migrainosus: Secondary | ICD-10-CM

## 2020-02-26 DIAGNOSIS — G43711 Chronic migraine without aura, intractable, with status migrainosus: Secondary | ICD-10-CM | POA: Diagnosis not present

## 2020-03-03 ENCOUNTER — Ambulatory Visit: Payer: BC Managed Care – PPO | Attending: Internal Medicine

## 2020-03-03 DIAGNOSIS — Z23 Encounter for immunization: Secondary | ICD-10-CM | POA: Insufficient documentation

## 2020-03-03 NOTE — Progress Notes (Signed)
   Covid-19 Vaccination Clinic  Name:  Selena Swaminathan    MRN: [id number] DOB: December 19, 1990  03/03/2020  Ms. Sarrazin was observed post Covid-19 immunization for 15 minutes without incident. She was provided with Vaccine Information Sheet and instruction to access the V-Safe system.   Ms. Dogan was instructed to call 911 with any severe reactions post vaccine: Marland Kitchen Difficulty breathing  . Swelling of face and throat  . A fast heartbeat  . A bad rash all over body  . Dizziness and weakness

## 2020-03-25 ENCOUNTER — Telehealth: Payer: Self-pay

## 2020-03-25 NOTE — Telephone Encounter (Signed)
I submitted a PA for the pts. Aimovig and it was approved from 03/24/20-03/23/21.

## 2020-03-30 ENCOUNTER — Ambulatory Visit: Payer: BC Managed Care – PPO | Attending: Internal Medicine

## 2020-03-30 DIAGNOSIS — Z23 Encounter for immunization: Secondary | ICD-10-CM

## 2020-03-30 NOTE — Progress Notes (Signed)
   Covid-19 Vaccination Clinic  Name:  Marilyn Hunter    MRN: [id number] DOB: [birth year]  03/30/2020  [name] was observed post Covid-19 immunization for 15 minutes without incident. She was provided with Vaccine Information Sheet and instruction to access the V-Safe system.   [name] was instructed to call 911 with any severe reactions post vaccine: Marland Kitchen Difficulty breathing  . Swelling of face and throat  . A fast heartbeat  . A bad rash all over body  . Dizziness and weakness   Immunizations Administered    Name Date Dose VIS Date Route   Pfizer COVID-19 Vaccine 03/30/2020  3:37 PM 0.3 mL 12/11/2019 Intramuscular   Manufacturer: [employer], [employer]   Lot: [id number]   NDC: [address]-9

## 2020-04-27 NOTE — Progress Notes (Signed)
NEUROLOGY FOLLOW UP OFFICE NOTE  Marilyn Hunter [id number]  HISTORY OF PRESENT ILLNESS: Marilyn Hunter is a 30 year old right-handed female who follows up for migraine.   UPDATE: Still with persistent moderate headache that breaks with Ubrelvy for a couple of hours but then returns. They are severe with intensity, nausea, vomiting and visual disturbance that occur twice a week.   Current NSAIDS:  none Current analgesics:  none Current triptans:  none Current ergotamine:  none Current anti-emetic:  Promethazine 86m PO and PR; Zofran ODT 876m(more effective) Current muscle relaxants:  none Current anti-anxiolytic:  none Current sleep aide:  none Current Antihypertensive medications:  none Current Antidepressant medications:  none Current Anticonvulsant medications:  none Current anti-CGRP:  Aimovig 7079monthly; Ubrelvy 100m34meadache resolves in 2 hours but returns in around 8 hours) Current Vitamins/Herbal/Supplements:  none Current Antihistamines/Decongestants:  none Other therapy:  none Hormone/birth control:  none  Caffeine:  2 cups of coffee daily.  No soda Alcohol:  no Smoker:  no Diet:  At least 60 oz water daily.  No soda.  Does not skip meals. Exercise:  Not routine Depression:  no; Anxiety:  Sometimes feels overwhelmed. Other pain:  no Sleep hygiene:  Poor.  May get 2-3 hours when the migraine is severe.  HISTORY: She has had migraines in her early 20s.41shey are typically severe pulsating pain behind the right eye but has diffuse head pressure.  Associated with  Spots in her visual field, sensation of whole-body pressure, dizziness, photophobia, phonophobia, osmophobia, nausea, vomiting, .Once she starts vomiting, she cannot stop.  Triggers include cheese, almonds, alcohol, cigarette smoke, strong perfumes and menstrual cycle.  Relieving factors include quiet environment, ice pack, apply pressure on eye.  They have been uncontrolled and chronic for many years.   In 2017, she started getting ovarian cysts and had multiple menstrual cycles a month.  This led to increased migraine frequency.  She was going to be evaluated for endometriosis but then became pregnant.  She had her son in January 2019 and breast-fed for about a year.  At that time, she would have a migraine about once a month.  This past summer 2020, they have become worse.  In January 2021, she then started having a new associated symptom, sensation of right sided numbness behind her right eye and down right side of her throat.  They have been lasting 2 weeks with maybe 2 days of headache freedom in between attacks.  MRI of brain with and without contrast on 02/11/2020 was normal.   Past NSAIDS/steroids:  Ibuprofen 800mg36mproxen; diclofenac tablet; ketorolac 10mg 39met; prednisone Past analgesics:  Excedrin Migraine; tramadol; Tylenol Past abortive triptans:  Frova 2.5mg; M45mlt; Relpax; sumatriptan po/NS Past abortive ergotamine:  none Past muscle relaxants:  Robaxin Past anti-emetic:  none Past antihypertensive medications:  none Past antidepressant medications:  Amitriptyline; nortriptyline Past anticonvulsant medications:  topiramate Past anti-CGRP:  none Past vitamins/Herbal/Supplements:  none Past antihistamines/decongestants:  none Other past therapies:  none   Family history of headache:  Maternal aunt (migraines); maternal grandmother (migraines)  PAST MEDICAL HISTORY: Past Medical History:  Diagnosis Date  . Abnormal Pap smear 2006   ASCUS, pos HR HPV  . Allergic rhinitis, cause unspecified   . Common migraine with intractable migraine 04/03/2017  . Migraines    chronic  . Ovarian cyst   . Vaginal Pap smear, abnormal     MEDICATIONS: Current Outpatient Medications on File Prior to Visit  Medication Sig  Dispense Refill  . acetaminophen (TYLENOL) 500 MG tablet Take 500 mg by mouth every 6 (six) hours as needed.    Marilyn Hunter (AIMOVIG) 70 MG/ML SOAJ Inject 70  mg into the skin every 30 (thirty) days. 3 pen 3  . frovatriptan (FROVA) 2.5 MG tablet Take 1 tablet (2.5 mg total) by mouth as needed for migraine. If recurs, may repeat after 2 hours. Max of 3 tabs in 24 hours. (Patient not taking: Reported on 02/26/2020) 10 tablet 1  . ondansetron (ZOFRAN ODT) 8 MG disintegrating tablet Take 1 tablet (8 mg total) by mouth every 8 (eight) hours as needed for nausea or vomiting. 20 tablet 1  . promethazine (PHENERGAN) 25 MG suppository Place 1 suppository (25 mg total) rectally every 6 (six) hours as needed for nausea or vomiting. 12 each 0  . promethazine (PHENERGAN) 25 MG tablet Take 25 mg by mouth every 6 (six) hours as needed for nausea or vomiting.    Marilyn Hunter Kitchen Ubrogepant (UBRELVY) 100 MG TABS Take 1 tablet by mouth as directed. When you get an aura and may repeat in 2 hours 10 tablet 1   No current facility-administered medications on file prior to visit.    ALLERGIES: No Known Allergies  FAMILY HISTORY: Family History  Problem Relation Age of Onset  . Hodgkin's lymphoma Mother 32  . Thyroid cancer Mother        (from radiation/hodgkin's treatment)  . Breast cancer Mother 83       possibly related to radiation treatment for hodgkins  BRCA test is negative  . Cancer Mother        hodgkin's; thyroid; breast; now new tumor in thoracic spine from possible breast cancer.  . Cervical cancer Maternal Aunt 53       stage III - clinical trial - BRCA negative  . Hyperlipidemia Maternal Grandfather   . Hypertension Maternal Grandmother   . Autism Cousin   . Diabetes Neg Hx   . Heart disease Neg Hx    SOCIAL HISTORY: Social History   Socioeconomic History  . Marital status: Married    Spouse name: Not on file  . Number of children: 0  . Years of education: Doctorate  . Highest education level: Not on file  Occupational History  . Occupation: Insurance underwriter: [employer]  Tobacco Use  . Smoking status: Never Smoker  . Smokeless tobacco: Never  Used  Substance and Sexual Activity  . Alcohol use: Not Currently  . Drug use: No  . Sexual activity: Yes    Partners: Male    Birth control/protection: Condom    Comment: paragard inserted 05/11/15, removed 11/2016  Other Topics Concern  . Not on file  Social History Narrative   Works as a Chief Executive Officer, passed bar exam in 2017. Lives with her husband.   Right-handed   Caffeine: 2-3 cups of coffee per day   Social Determinants of Health   Financial Resource Strain:   . Difficulty of Paying Living Expenses:   Food Insecurity:   . Worried About Charity fundraiser in the Last Year:   . Arboriculturist in the Last Year:   Transportation Needs:   . Film/video editor (Medical):   Marilyn Hunter Kitchen Lack of Transportation (Non-Medical):   Physical Activity:   . Days of Exercise per Week:   . Minutes of Exercise per Session:   Stress:   . Feeling of Stress :   Social Connections:   . Frequency  of Communication with Friends and Family:   . Frequency of Social Gatherings with Friends and Family:   . Attends Religious Services:   . Active Member of Clubs or Organizations:   . Attends Archivist Meetings:   Marilyn Hunter Kitchen Marital Status:   Intimate Partner Violence:   . Fear of Current or Ex-Partner:   . Emotionally Abused:   Marilyn Hunter Kitchen Physically Abused:   . Sexually Abused:     PHYSICAL EXAM: Blood pressure 113/77, pulse (!) 59, resp. rate 20, height 5' 7" (1.702 m), weight 173 lb (78.5 kg), SpO2 95 %, unknown if currently breastfeeding. General: No acute distress.  Patient appears well-groomed.   Head:  Normocephalic/atraumatic Eyes:  Fundi examined but not visualized Neck: supple, no paraspinal tenderness, full range of motion Heart:  Regular rate and rhythm Lungs:  Clear to auscultation bilaterally Back: No paraspinal tenderness Neurological Exam: alert and oriented to person, place, and time. Attention span and concentration intact, recent and remote memory intact, fund of knowledge intact.  Speech  fluent and not dysarthric, language intact.  CN II-XII intact. Bulk and tone normal, muscle strength 5/5 throughout.  Sensation to light touch, temperature and vibration intact.  Deep tendon reflexes 2+ throughout, toes downgoing.  Finger to nose and heel to shin testing intact.  Gait normal, Romberg negative.  IMPRESSION: Migraine without aura, without status migrainosus, not intractable Migraine with aura, without status migrainosus, not intractable  PLAN: 1.  For preventative management, stop Aimovig.  Given very little improvement on 23m, I don't think increasing dose to 1469mwill be much better as she has virtually had no improvement on 7032m She has had near daily headaches for well over 3 consecutive months and has failed multiple preventatives such as Aimovig, topiramate, amitriptyline and nortriptyline.  She would be an appropriate candidate for Botox.  Will set her up. 2.  For abortive therapy, she will try Nurtec as it may be more effective due to her significant nausea.  Zofran ODT 8mg30mr nausea 3.  Limit use of pain relievers to no more than 2 days out of week to prevent risk of rebound or medication-overuse headache. 4.  Keep headache diary 5.  Exercise, hydration, caffeine cessation, sleep hygiene, monitor for and avoid triggers 6. Follow up for Botox   AdamMetta Clines  CC: JohnJill Alexanders

## 2020-04-28 ENCOUNTER — Ambulatory Visit: Payer: BC Managed Care – PPO | Admitting: Neurology

## 2020-04-28 ENCOUNTER — Encounter: Payer: Self-pay | Admitting: Neurology

## 2020-04-28 ENCOUNTER — Other Ambulatory Visit: Payer: Self-pay

## 2020-04-28 VITALS — BP 113/77 | HR 59 | Resp 20 | Ht 67.0 in | Wt 173.0 lb

## 2020-04-28 DIAGNOSIS — G43901 Migraine, unspecified, not intractable, with status migrainosus: Secondary | ICD-10-CM | POA: Diagnosis not present

## 2020-04-28 DIAGNOSIS — G43711 Chronic migraine without aura, intractable, with status migrainosus: Secondary | ICD-10-CM | POA: Diagnosis not present

## 2020-04-28 MED ORDER — ONDANSETRON 8 MG PO TBDP: 8.0000 mg | ORAL_TABLET | Freq: Three times a day (TID) | ORAL | 5 refills | Status: DC | PRN

## 2020-04-28 NOTE — Patient Instructions (Signed)
1.  We will try to start Botox.  Stop Aimovig 2.  For rescue, try Nurtec, 1 dissolvable tablet daily as needed. 3.  Refilled ondansetron 4.  Follow up in 4 months or for Botox

## 2020-05-06 ENCOUNTER — Telehealth: Payer: BC Managed Care – PPO | Admitting: Medical

## 2020-05-06 ENCOUNTER — Encounter: Payer: Self-pay | Admitting: Medical

## 2020-05-06 ENCOUNTER — Other Ambulatory Visit: Payer: Self-pay

## 2020-05-06 VITALS — Wt 165.0 lb

## 2020-05-06 DIAGNOSIS — J0100 Acute maxillary sinusitis, unspecified: Secondary | ICD-10-CM | POA: Diagnosis not present

## 2020-05-06 DIAGNOSIS — R059 Cough, unspecified: Secondary | ICD-10-CM

## 2020-05-06 DIAGNOSIS — R05 Cough: Secondary | ICD-10-CM

## 2020-05-06 MED ORDER — AMOXICILLIN 875 MG PO TABS: 875.0000 mg | ORAL_TABLET | Freq: Two times a day (BID) | ORAL | 0 refills | Status: DC

## 2020-05-06 MED ORDER — BENZONATATE 100 MG PO CAPS: 100.0000 mg | ORAL_CAPSULE | Freq: Two times a day (BID) | ORAL | 0 refills | Status: DC | PRN

## 2020-05-06 NOTE — Progress Notes (Signed)
Subjective:     Patient ID: Marilyn Hunter, female   DOB: [birth year], 30 y.o.   MRN: [id number]  This visit type was conducted due to national recommendations for restrictions regarding the COVID-19 Pandemic (e.g. social distancing) in an effort to limit this patient's exposure and mitigate transmission in our community.  Due to their co-morbid illnesses, this patient is at least at moderate risk for complications without adequate follow up.  This format is felt to be most appropriate for this patient at this time.    Documentation for virtual audio and video telecommunications through Zoom encounter:  The patient was located at home. The provider was located in the office. The patient did consent to this visit and is aware of possible charges through their insurance for this visit.  The other persons participating in this telemedicine service were none. Time spent on call was 20 minutes and in review of previous records 20 minutes total.  This virtual service is not related to other E/M service within previous 7 days.   HPI Chief Complaint  Patient presents with  . Sinusitis   Virtual consult for possible sinus infection.  Has had about a week of runny nose, sore throat, sinus pressure, coughing up phlegm.   Feels like crap today, but hasn't felt as bad earlier in the week.   Sleeping with windows open, fan blowing.   Allergies really flaring up sinuses.   Has been using zyrtec.  In general does not like to use nasal saline.  No sick contacts.  She is around very limited people.  She is fully vaccinated with Covid vaccine.  She has been sleeping with windows open as her air conditioner quit working and they are waiting on getting this fixed.  Otherwise doing fine.No other aggravating or relieving factors. No other complaint.  Past Medical History:  Diagnosis Date  . Abnormal Pap smear 2006   ASCUS, pos HR HPV  . Allergic rhinitis, cause unspecified   . Common migraine with intractable  migraine 04/03/2017  . Migraines    chronic  . Ovarian cyst   . Vaginal Pap smear, abnormal    Current Outpatient Medications on File Prior to Visit  Medication Sig Dispense Refill  . acetaminophen (TYLENOL) 500 MG tablet Take 500 mg by mouth every 6 (six) hours as needed.    . frovatriptan (FROVA) 2.5 MG tablet Take 1 tablet (2.5 mg total) by mouth as needed for migraine. If recurs, may repeat after 2 hours. Max of 3 tabs in 24 hours. 10 tablet 1  . promethazine (PHENERGAN) 25 MG suppository Place 1 suppository (25 mg total) rectally every 6 (six) hours as needed for nausea or vomiting. 12 each 0  . promethazine (PHENERGAN) 25 MG tablet Take 25 mg by mouth every 6 (six) hours as needed for nausea or vomiting.    . ondansetron (ZOFRAN ODT) 8 MG disintegrating tablet Take 1 tablet (8 mg total) by mouth every 8 (eight) hours as needed for nausea or vomiting. (Patient not taking: Reported on 05/06/2020) 20 tablet 5  . Ubrogepant (UBRELVY) 100 MG TABS Take 1 tablet by mouth as directed. When you get an aura and may repeat in 2 hours 10 tablet 1   No current facility-administered medications on file prior to visit.   Review of Systems As in subjective    Objective:   Physical Exam Due to coronavirus pandemic stay at home measures, patient visit was virtual and they were not examined in person.   Wt  165 lb (74.8 kg)   BMI 25.84 kg/m       Assessment:     Encounter Diagnoses  Name Primary?  . Acute non-recurrent maxillary sinusitis Yes  . Cough        Plan:     Discussed symptoms and concerns.  Advise she continue Zyrtec, but consider Sudafed or Mucinex the next several days.  Advise nasal saline if she can tolerate this.  Discussed method of nasal saline flush.  Continue good hydration with clear fluids and water.  Can use Tessalon Perles below for cough, begin amoxicillin for sinusitis.  If worse or not improving over the next several days to call or return  Marilyn Hunter was seen today for  sinusitis.  Diagnoses and all orders for this visit:  Acute non-recurrent maxillary sinusitis  Cough  Other orders -     amoxicillin (AMOXIL) 875 MG tablet; Take 1 tablet (875 mg total) by mouth 2 (two) times daily. -     benzonatate (TESSALON) 100 MG capsule; Take 1 capsule (100 mg total) by mouth 2 (two) times daily as needed for cough.

## 2020-05-19 ENCOUNTER — Encounter: Payer: Self-pay | Admitting: *Deleted

## 2020-05-19 NOTE — Progress Notes (Addendum)
Started via [employer] Benefit Verification [id number]-ROZFUAT Submitted!  Reply:  [id number]  Covered - Per Insurer Guidelines BOTOX (Y2026)  Buy and Bill  And/or  Specialty Pharmacy Prior Authorization Required - Supporting Doc Required  If using a specialty pharmacy the preferred Specialty Pharmacies: [facility] [phone] [facility] [phone]  PA Submitted through CoverMyMeds  ARDEN AXON (Key: [id number])  Your information has been submitted to [facility]. [employer] [place] will review the request and notify you of the determination decision directly, typically within 72 hours of receiving all information.  You will also receive your request decision electronically. To check for an update later, open this request again from your dashboard.  If [employer] [facility] has not responded within the specified timeframe or if you have any questions about your PA submission, contact [employer] [place] directly at [phone]

## 2020-05-26 NOTE — Progress Notes (Signed)
Marilyn Hunter Key: [id number] help? Call us at [phone] Outcome Approvedon May 24 Effective from 05/20/2020 through 11/15/2020. initial Drug Botox 200UNIT solution Form [employer] [place] Commercial Medical Benefit Electronic Request Form (CB)

## 2020-06-02 ENCOUNTER — Other Ambulatory Visit: Payer: Self-pay | Admitting: Neurology

## 2020-06-02 MED ORDER — NURTEC 75 MG PO TBDP: 1.0000 | ORAL_TABLET | Freq: Every day | ORAL | 11 refills | Status: DC | PRN

## 2020-06-10 ENCOUNTER — Other Ambulatory Visit: Payer: Self-pay

## 2020-06-10 ENCOUNTER — Ambulatory Visit (INDEPENDENT_AMBULATORY_CARE_PROVIDER_SITE_OTHER): Payer: BC Managed Care – PPO | Admitting: Neurology

## 2020-06-10 DIAGNOSIS — G43711 Chronic migraine without aura, intractable, with status migrainosus: Secondary | ICD-10-CM

## 2020-06-10 MED ORDER — ONABOTULINUMTOXINA 100 UNITS IJ SOLR
175.0000 [IU] | Freq: Once | INTRAMUSCULAR | Status: AC
  Administered 2020-06-10: 155 [IU] via INTRAMUSCULAR

## 2020-06-10 NOTE — Progress Notes (Signed)
Botulinum Clinic   Procedure Note Botox  Attending: Dr. Shon Millet  Preoperative Diagnosis(es): Chronic migraine  Consent obtained from: The patient Benefits discussed included, but were not limited to decreased muscle tightness, increased joint range of motion, and decreased pain.  Risk discussed included, but were not limited pain and discomfort, bleeding, bruising, excessive weakness, venous thrombosis, muscle atrophy and dysphagia.  Anticipated outcomes of the procedure as well as he risks and benefits of the alternatives to the procedure, and the roles and tasks of the personnel to be involved, were discussed with the patient, and the patient consents to the procedure and agrees to proceed. A copy of the patient medication guide was given to the patient which explains the blackbox warning.  Patients identity and treatment sites confirmed Yes.  .  Details of Procedure: Skin was cleaned with alcohol. Prior to injection, the needle plunger was aspirated to make sure the needle was not within a blood vessel.  There was no blood retrieved on aspiration.    Following is a summary of the muscles injected  And the amount of Botulinum toxin used:  Dilution 200 units of Botox was reconstituted with 4 ml of preservative free normal saline. Time of reconstitution: At the time of the office visit (<30 minutes prior to injection)   Injections  155 total units of Botox was injected with a 30 gauge needle.  Injection Sites: L occipitalis: 15 units- 3 sites  R occiptalis: 15 units- 3 sites  L upper trapezius: 15 units- 3 sites R upper trapezius: 15 units- 3 sits          L paraspinal: 10 units- 2 sites R paraspinal: 10 units- 2 sites  Face L frontalis(2 injection sites):10 units   R frontalis(2 injection sites):10 units         L corrugator: 5 units   R corrugator: 5 units           Procerus: 5 units   L temporalis: 20 units R temporalis: 20 units   Agent:  200 units of botulinum Type  A (Onobotulinum Toxin type A) was reconstituted with 4 ml of preservative free normal saline.  Time of reconstitution: At the time of the office visit (<30 minutes prior to injection)     Total injected (Units): 155  Total wasted (Units): 20  Patient tolerated procedure well without complications.   Reinjection is anticipated in 3 months. Return to clinic in 4 1/2 months.

## 2020-07-18 ENCOUNTER — Encounter: Payer: Self-pay | Admitting: Neurology

## 2020-07-18 NOTE — Progress Notes (Signed)
Marilyn Hunter (Key: [id number]) Rx #: [id number] Nurtec 75MG  dispersible tablets   Form Blue Form (CB) Created 2 days ago Sent to Plan 3 minutes ago Plan Response 3 minutes ago Submit Clinical Questions less than a minute ago Determination Wait for Determination Please wait for [employer] [place] [employer] cb central to return a determination.

## 2020-07-18 NOTE — Progress Notes (Signed)
Received fax from [facility] about needing to fill out initial PA form and fax to them for patient on this medication. Filled out and faxed back 07/18/20; awaiting determination.

## 2020-07-21 NOTE — Progress Notes (Signed)
TASHE PURDON (Key: [id number]) Rx #: [id number] Nurtec 75MG  dispersible tablets   Form Blue Form (CB) Created 5 days ago Sent to Plan 3 days ago Plan Response 3 days ago Submit Clinical Questions 3 days ago Determination Favorable 18 hours ago Message from Plan Effective from 07/18/2020 through 10/17/2020. 90 days x initial, prevention

## 2020-09-02 ENCOUNTER — Ambulatory Visit: Payer: BC Managed Care – PPO | Admitting: Neurology

## 2020-09-09 ENCOUNTER — Other Ambulatory Visit: Payer: Self-pay

## 2020-09-09 ENCOUNTER — Ambulatory Visit: Payer: BC Managed Care – PPO | Admitting: Neurology

## 2020-09-09 ENCOUNTER — Ambulatory Visit (INDEPENDENT_AMBULATORY_CARE_PROVIDER_SITE_OTHER): Payer: BC Managed Care – PPO | Admitting: Neurology

## 2020-09-09 DIAGNOSIS — G43711 Chronic migraine without aura, intractable, with status migrainosus: Secondary | ICD-10-CM

## 2020-09-09 MED ORDER — ONABOTULINUMTOXINA 100 UNITS IJ SOLR
160.0000 [IU] | Freq: Once | INTRAMUSCULAR | Status: AC
  Administered 2020-09-09: 155 [IU] via INTRAMUSCULAR

## 2020-09-09 NOTE — Progress Notes (Signed)
Botulinum Clinic  ° °Procedure Note Botox ° °Attending: Dr. Evadean Sproule ° °Preoperative Diagnosis(es): Chronic migraine ° °Consent obtained from: The patient °Benefits discussed included, but were not limited to decreased muscle tightness, increased joint range of motion, and decreased pain.  Risk discussed included, but were not limited pain and discomfort, bleeding, bruising, excessive weakness, venous thrombosis, muscle atrophy and dysphagia.  Anticipated outcomes of the procedure as well as he risks and benefits of the alternatives to the procedure, and the roles and tasks of the personnel to be involved, were discussed with the patient, and the patient consents to the procedure and agrees to proceed. A copy of the patient medication guide was given to the patient which explains the blackbox warning. ° °Patients identity and treatment sites confirmed Yes.  . ° °Details of Procedure: °Skin was cleaned with alcohol. Prior to injection, the needle plunger was aspirated to make sure the needle was not within a blood vessel.  There was no blood retrieved on aspiration.   ° °Following is a summary of the muscles injected  And the amount of Botulinum toxin used: ° °Dilution °200 units of Botox was reconstituted with 4 ml of preservative free normal saline. °Time of reconstitution: At the time of the office visit (<30 minutes prior to injection)  ° °Injections  °155 total units of Botox was injected with a 30 gauge needle. ° °Injection Sites: °L occipitalis: 15 units- 3 sites  °R occiptalis: 15 units- 3 sites ° °L upper trapezius: 15 units- 3 sites °R upper trapezius: 15 units- 3 sits          °L paraspinal: 10 units- 2 sites °R paraspinal: 10 units- 2 sites ° °Face °L frontalis(2 injection sites):10 units   °R frontalis(2 injection sites):10 units         °L corrugator: 5 units   °R corrugator: 5 units           °Procerus: 5 units   °L temporalis: 20 units °R temporalis: 20 units  ° °Agent:  °200 units of botulinum Type  A (Onobotulinum Toxin type A) was reconstituted with 4 ml of preservative free normal saline.  °Time of reconstitution: At the time of the office visit (<30 minutes prior to injection)  ° ° ° Total injected (Units):  155 ° Total wasted (Units):  5 ° °Patient tolerated procedure well without complications.   °Reinjection is anticipated in 3 months. ° ° °

## 2020-09-19 ENCOUNTER — Encounter: Payer: Self-pay | Admitting: Neurology

## 2020-09-19 NOTE — Progress Notes (Addendum)
Marilyn Hunter (Key: [id number]) Rx #: [id number] Nurtec 75MG  dispersible tablets   Form [employer] [place] Form (CB) Created 2 months ago Sent to Plan 2 months ago Plan Response 2 months ago Submit Clinical Questions 2 months ago Determination Favorable 2 months ago Message from Plan Effective from 07/18/2020 through 10/17/2020. 90 days x initial, prevention

## 2020-10-12 ENCOUNTER — Other Ambulatory Visit: Payer: Self-pay

## 2020-10-12 ENCOUNTER — Encounter: Payer: Self-pay | Admitting: Family Medicine

## 2020-10-12 ENCOUNTER — Ambulatory Visit: Payer: BC Managed Care – PPO | Admitting: Family Medicine

## 2020-10-12 VITALS — BP 112/70 | HR 88 | Ht 67.0 in | Wt 182.2 lb

## 2020-10-12 DIAGNOSIS — R112 Nausea with vomiting, unspecified: Secondary | ICD-10-CM | POA: Diagnosis not present

## 2020-10-12 DIAGNOSIS — G43831 Menstrual migraine, intractable, with status migrainosus: Secondary | ICD-10-CM

## 2020-10-12 MED ORDER — KETOROLAC TROMETHAMINE 60 MG/2ML IM SOLN
60.0000 mg | Freq: Once | INTRAMUSCULAR | Status: AC
  Administered 2020-10-12: 60 mg via INTRAMUSCULAR

## 2020-10-12 MED ORDER — PROMETHAZINE HCL 25 MG/ML IJ SOLN
25.0000 mg | Freq: Once | INTRAMUSCULAR | Status: AC
  Administered 2020-10-12: 25 mg via INTRAMUSCULAR

## 2020-10-12 NOTE — Progress Notes (Signed)
Chief Complaint  Patient presents with  . Migraine    2-3 days. Took nurtec and zofran it did not work. Also does botox at neuro for migraine. Has also been vomiting from the mirgraine today. Has had tordol with JCL in the past and it has helped.   . Immunizations    would like to wait on flu shot.    Patient presents for treatment of migraine.  She sees Dr. Everlena Cooper. She took Nurtec this morning, and 2 doses of Zofran, but neither have helped.  She reports that she keeps throwing up, can't stop the nausea.  She describes her headache as a knifelike pain atove her right eye, with associated photophobia, phonophobia, nausea, similar to her previous migraines.  No fever, chills, URI symptoms. No focal neuro symptoms other than some blurred vision and slight visual field cut per usual.  +work stress, hasn't been sleeping well.  Through Dr. Everlena Cooper she has been getting Botox injections, which reduces the frequency. Prior to Botox she was getting migraines multiple times/week, lasting 4-5 days. Has had only 2 Botox treatments, and migraines are less frequent, shorter duration.  She has had a headache for a few days, but woke up with intense HA at 2am.  PMH, PSH, SH reviewed  Outpatient Encounter Medications as of 10/12/2020  Medication Sig  . ondansetron (ZOFRAN ODT) 8 MG disintegrating tablet Take 1 tablet (8 mg total) by mouth every 8 (eight) hours as needed for nausea or vomiting.  . Rimegepant Sulfate (NURTEC) 75 MG TBDP Take 1 tablet by mouth daily as needed (Maximum 1 tablet in 24 hours).  Marland Kitchen acetaminophen (TYLENOL) 500 MG tablet Take 500 mg by mouth every 6 (six) hours as needed. (Patient not taking: Reported on 10/12/2020)  . frovatriptan (FROVA) 2.5 MG tablet Take 1 tablet (2.5 mg total) by mouth as needed for migraine. If recurs, may repeat after 2 hours. Max of 3 tabs in 24 hours. (Patient not taking: Reported on 10/12/2020)  . promethazine (PHENERGAN) 25 MG suppository Place 1  suppository (25 mg total) rectally every 6 (six) hours as needed for nausea or vomiting. (Patient not taking: Reported on 10/12/2020)  . promethazine (PHENERGAN) 25 MG tablet Take 25 mg by mouth every 6 (six) hours as needed for nausea or vomiting. (Patient not taking: Reported on 10/12/2020)  . Ubrogepant (UBRELVY) 100 MG TABS Take 1 tablet by mouth as directed. When you get an aura and may repeat in 2 hours (Patient not taking: Reported on 10/12/2020)   No facility-administered encounter medications on file as of 10/12/2020.   No Known Allergies  ROS: no fever, chills, URI symptoms, dizziness, vertigo, numbness, tingling, weakness. See HPI.  No bleeding, rash or other complaints except as noted in HPI   PHYSICAL EXAM:  BP 112/70   Pulse 88   Ht 5\' 7"  (1.702 m)   Wt 182 lb 3.2 oz (82.6 kg)   BMI 28.54 kg/m   Female is sitting in dark room with her husband. She doesn't appear overly sick--she seems alert, easily conversive, in mild discomfort. HEENT: conjunctiva and sclera are clear, EOMI, fundi benign. Neck: no lymphadenopathy, thyromegaly, FROM without meningismus Heart: regular rate and rhythm Lungs: clear bilaterally Neuro: alert and oriented, cranial nerves intact.  Normal strength, DTR's, gait  ASSESSMENT/PLAN:  Intractable menstrual migraine with status migrainosus - Plan: promethazine (PHENERGAN) injection 25 mg, promethazine (PHENERGAN) injection 25 mg, ketorolac (TORADOL) injection 60 mg  Nausea and vomiting, intractability of vomiting not specified, unspecified vomiting type -  Plan: promethazine (PHENERGAN) injection 25 mg, promethazine (PHENERGAN) injection 25 mg, ketorolac (TORADOL) injection 60 mg  toradol 60mg  Phenergan 50mg  IM  F/u with neuro if not resolving; discussed poss need for steroids.

## 2020-10-28 ENCOUNTER — Encounter: Payer: Self-pay | Admitting: Neurology

## 2020-10-28 NOTE — Progress Notes (Addendum)
Marilyn Hunter (Key: [id number]) Nurtec 75MG  dispersible tablets   Form Blue Form (CB) Created 3 days ago Sent to Plan 3 days ago Plan Response 3 days ago Submit Clinical Questions 3 days ago Determination Favorable 16 hours ago Message from Plan Effective from 10/28/2020 through 10/27/2021.

## 2020-10-31 NOTE — Progress Notes (Signed)
NEUROLOGY FOLLOW UP OFFICE NOTE  Marilyn Hunter [id number]  HISTORY OF PRESENT ILLNESS: Marilyn Hunter is a 30 year old right-handed female who follows up for migraines.   UPDATE: Status post 2 rounds of Botox. She does note some improvement but nothing significant. Overall, headaches less intense, last a day with Nurtec and occur 7 to 8 days outside of her period.  Menstrual related migraines are still severe and intractable.  She will have a dull but nagging headache for 3 to 4 days prior to her period and then a severe intractable headache with nausea and vomiting the day before her period.  For the next few days, she will hold a moderate headache.  Current NSAIDS:none Current analgesics:none Current triptans:none Current ergotamine:none Current anti-emetic:Zofran ODT 51m (more effective) Current muscle relaxants:none Current anti-anxiolytic:none Current sleep aide:none Current Antihypertensive medications:none Current Antidepressant medications:none Current Anticonvulsant medications:none Current anti-CGRP:Nurtec (rescue) Current Vitamins/Herbal/Supplements:none Current Antihistamines/Decongestants:none Other therapy:Botox Hormone/birth control:none  Caffeine:2 cups of coffee daily. No soda Alcohol:no Smoker:no Diet:At least 60 oz water daily. No soda. Does not skip meals. Exercise:Not routine Depression:no; Anxiety:Sometimes feels overwhelmed. Other pain:no Sleep hygiene:Poor. May get 2-3 hours when the migraine is severe.  HISTORY: She has had migrainesin her early 280s They are typically severe pulsating pain behind the right eye but has diffuse head pressure.Associated with Spots in her visual field, sensation of whole-body pressure, dizziness, photophobia, phonophobia, osmophobia, nausea, vomiting, .Once she starts vomiting, she cannot stop. Triggers include cheese, almonds, alcohol, cigarette smoke,  strong perfumes and menstrual cycle. Relieving factors include quiet environment, ice pack, apply pressure on eye. They have been uncontrolled and chronic for many years. In 2017, she started getting ovarian cysts and had multiple menstrual cycles a month. This led to increased migraine frequency. She was going to be evaluated for endometriosis but then became pregnant. She had her son in January 2019 and breast-fed for about a year. At that time, she would have a migraine about once a month. This past summer 2020, they have become worse. In January 2021, she then started having a new associated symptom, sensation of right sided numbness behind her right eye and down right side of her throat. They have been lasting 2 weeks with maybe 2 days of headache freedom in between attacks.  MRI of brain with and without contrast on 02/11/2020 was normal.   Past NSAIDS/steroids:Ibuprofen 8070m naproxen; diclofenac tablet; ketorolac 1036mablet; prednisone Past analgesics:Excedrin Migraine; tramadol; Tylenol Past abortive triptans:Frova 2.5mg61maxalt; Relpax; sumatriptan po/NS Past abortive ergotamine:none Past muscle relaxants:Robaxin Past anti-emetic:promethazine 25mg45mPast antihypertensive medications:none Past antidepressant medications:Amitriptyline; nortriptyline Past anticonvulsant medications:topiramate Past anti-CGRP:Aimovig 70mg 25mhly; Ubrelvy 100mg(h52mche resolves in 2 hours but returns in around 8 hours) Past vitamins/Herbal/Supplements:none Past antihistamines/decongestants:none Other past therapies:none   Family history of headache:Maternal aunt (migraines); maternal grandmother (migraines)  PAST MEDICAL HISTORY: Past Medical History:  Diagnosis Date   Abnormal Pap smear 2006   ASCUS, pos HR HPV   Allergic rhinitis, cause unspecified    Common migraine with intractable migraine 04/03/2017   Migraines    chronic   Ovarian cyst     Vaginal Pap smear, abnormal     MEDICATIONS: Current Outpatient Medications on File Prior to Visit  Medication Sig Dispense Refill   acetaminophen (TYLENOL) 500 MG tablet Take 500 mg by mouth every 6 (six) hours as needed. (Patient not taking: Reported on 10/12/2020)     frovatriptan (FROVA) 2.5 MG tablet Take 1 tablet (2.5 mg total) by mouth as needed for  migraine. If recurs, may repeat after 2 hours. Max of 3 tabs in 24 hours. (Patient not taking: Reported on 10/12/2020) 10 tablet 1   ondansetron (ZOFRAN ODT) 8 MG disintegrating tablet Take 1 tablet (8 mg total) by mouth every 8 (eight) hours as needed for nausea or vomiting. 20 tablet 5   promethazine (PHENERGAN) 25 MG suppository Place 1 suppository (25 mg total) rectally every 6 (six) hours as needed for nausea or vomiting. (Patient not taking: Reported on 10/12/2020) 12 each 0   promethazine (PHENERGAN) 25 MG tablet Take 25 mg by mouth every 6 (six) hours as needed for nausea or vomiting. (Patient not taking: Reported on 10/12/2020)     Rimegepant Sulfate (NURTEC) 75 MG TBDP Take 1 tablet by mouth daily as needed (Maximum 1 tablet in 24 hours). 8 tablet 11   Ubrogepant (UBRELVY) 100 MG TABS Take 1 tablet by mouth as directed. When you get an aura and may repeat in 2 hours (Patient not taking: Reported on 10/12/2020) 10 tablet 1   No current facility-administered medications on file prior to visit.    ALLERGIES: No Known Allergies  FAMILY HISTORY: Family History  Problem Relation Age of Onset   Hodgkin's lymphoma Mother 44   Thyroid cancer Mother        (from radiation/hodgkin's treatment)   Breast cancer Mother 84       possibly related to radiation treatment for hodgkins  BRCA test is negative   Cancer Mother        hodgkin's; thyroid; breast; now new tumor in thoracic spine from possible breast cancer.   Cervical cancer Maternal Aunt 53       stage III - clinical trial - BRCA negative   Hyperlipidemia Maternal  Grandfather    Hypertension Maternal Grandmother    Autism Cousin    Diabetes Neg Hx    Heart disease Neg Hx     SOCIAL HISTORY: Social History   Socioeconomic History   Marital status: Married    Spouse name: Not on file   Number of children: 0   Years of education: Doctorate   Highest education level: Not on file  Occupational History   Occupation: Insurance underwriter: [employer]  Tobacco Use   Smoking status: Never Smoker   Smokeless tobacco: Never Used  Scientific laboratory technician Use: Never used  Substance and Sexual Activity   Alcohol use: Not Currently   Drug use: No   Sexual activity: Yes    Partners: Male    Birth control/protection: Condom    Comment: paragard inserted 05/11/15, removed 11/2016  Other Topics Concern   Not on file  Social History Narrative   Works as a Chief Executive Officer, passed bar exam in 2017. Lives with her husband.   Right-handed   Caffeine: 2-3 cups of coffee per day   Social Determinants of Health   Financial Resource Strain:    Difficulty of Paying Living Expenses: Not on file  Food Insecurity:    Worried About Charity fundraiser in the Last Year: Not on file   [employer] of Food in the Last Year: Not on file  Transportation Needs:    Lack of Transportation (Medical): Not on file   Lack of Transportation (Non-Medical): Not on file  Physical Activity:    Days of Exercise per Week: Not on file   Minutes of Exercise per Session: Not on file  Stress:    Feeling of Stress : Not on  file  Social Connections:    Frequency of Communication with Friends and Family: Not on file   Frequency of Social Gatherings with Friends and Family: Not on file   Attends Religious Services: Not on file   Active Member of Clubs or Organizations: Not on file   Attends Archivist Meetings: Not on file   Marital Status: Not on file  Intimate Partner Violence:    Fear of Current or Ex-Partner: Not on file   Emotionally Abused:  Not on file   Physically Abused: Not on file   Sexually Abused: Not on file   PHYSICAL EXAM: Blood pressure 112/69, pulse 74, resp. rate 20, height 5' 7.5" (1.715 m), weight 186 lb (84.4 kg), SpO2 95 %, unknown if currently breastfeeding. General: No acute distress.  Patient appears well-groomed.    IMPRESSION: Chronic migraine Menstrual migraines, intractable  PLAN: 1.  We will try one more round of Botox.  I will have her follow up after 8 weeks for re-evaluation. If no significant improvement, will switch management. 2.  Will prescribe menstrual mini-prevention:  Naproxen 540m twice daily for 14 days beginning 7 days prior to period 3.  Provided samples of Zomig 523mNS for abortive therapy.  She has Zofran if needed. 4.  Limit use of pain relievers to no more than 2 days out of week to prevent risk of rebound or medication-overuse headache. 5.  Keep headache diary  Marilyn ClinesDO  CC: JoJill AlexandersMD

## 2020-11-01 ENCOUNTER — Other Ambulatory Visit: Payer: Self-pay

## 2020-11-01 ENCOUNTER — Encounter: Payer: Self-pay | Admitting: Neurology

## 2020-11-01 ENCOUNTER — Ambulatory Visit: Payer: BC Managed Care – PPO | Admitting: Neurology

## 2020-11-01 VITALS — BP 112/69 | HR 74 | Resp 20 | Ht 67.5 in | Wt 186.0 lb

## 2020-11-01 DIAGNOSIS — G43831 Menstrual migraine, intractable, with status migrainosus: Secondary | ICD-10-CM | POA: Diagnosis not present

## 2020-11-01 DIAGNOSIS — G43711 Chronic migraine without aura, intractable, with status migrainosus: Secondary | ICD-10-CM

## 2020-11-01 MED ORDER — NAPROXEN SODIUM 550 MG PO TABS: ORAL_TABLET | ORAL | 5 refills | Status: DC

## 2020-11-01 NOTE — Patient Instructions (Signed)
1.  As a mini-prevention of menstrual migraines, I will have you take naproxen 550mg :  Take 1 tablet twice daily for 2 weeks beginning 7 days prior to period 2.  When you get a migraine, use Zomig 5mg  nasal spray:  1 spray in one nostril.  May repeat after 2 hours.  Maximum 2 sprays in 24 hours.  If effective, contact me for prescription. 3.  Continue next round of Botox and follow up 8 weeks afterwards for virtual visit.

## 2020-11-04 ENCOUNTER — Encounter: Payer: Self-pay | Admitting: Neurology

## 2020-11-04 NOTE — Progress Notes (Addendum)
BV states that [facility] is the [facility].  11/8- Called [facility] to set up account and to give verbal script for the Botox.   [place] Key: [id number] - Rx #: [id number] Need help? Call us at [phone] Outcome Approvedtoday Effective from 11/17/2020 through 11/16/2021. Drug Botox 200UNIT solution Form [employer] [place] [employer]it Electronic Request Form (CB)  Approved for 4 visits.

## 2020-12-05 DIAGNOSIS — G43709 Chronic migraine without aura, not intractable, without status migrainosus: Secondary | ICD-10-CM | POA: Diagnosis not present

## 2020-12-09 ENCOUNTER — Ambulatory Visit: Payer: BC Managed Care – PPO | Attending: Internal Medicine

## 2020-12-09 ENCOUNTER — Ambulatory Visit (INDEPENDENT_AMBULATORY_CARE_PROVIDER_SITE_OTHER): Payer: BC Managed Care – PPO | Admitting: Neurology

## 2020-12-09 ENCOUNTER — Other Ambulatory Visit: Payer: Self-pay

## 2020-12-09 DIAGNOSIS — Z23 Encounter for immunization: Secondary | ICD-10-CM

## 2020-12-09 DIAGNOSIS — G43831 Menstrual migraine, intractable, with status migrainosus: Secondary | ICD-10-CM | POA: Diagnosis not present

## 2020-12-09 DIAGNOSIS — G43711 Chronic migraine without aura, intractable, with status migrainosus: Secondary | ICD-10-CM

## 2020-12-09 MED ORDER — ONABOTULINUMTOXINA 100 UNITS IJ SOLR
200.0000 [IU] | Freq: Once | INTRAMUSCULAR | Status: AC
  Administered 2020-12-09: 155 [IU] via INTRAMUSCULAR

## 2020-12-09 NOTE — Progress Notes (Signed)
Botulinum Clinic   Procedure Note Botox  Attending: Dr. Jermaine Tholl  Preoperative Diagnosis(es): Chronic migraine  Consent obtained from: The patient Benefits discussed included, but were not limited to decreased muscle tightness, increased joint range of motion, and decreased pain.  Risk discussed included, but were not limited pain and discomfort, bleeding, bruising, excessive weakness, venous thrombosis, muscle atrophy and dysphagia.  Anticipated outcomes of the procedure as well as he risks and benefits of the alternatives to the procedure, and the roles and tasks of the personnel to be involved, were discussed with the patient, and the patient consents to the procedure and agrees to proceed. A copy of the patient medication guide was given to the patient which explains the blackbox warning.  Patients identity and treatment sites confirmed Yes.  .  Details of Procedure: Skin was cleaned with alcohol. Prior to injection, the needle plunger was aspirated to make sure the needle was not within a blood vessel.  There was no blood retrieved on aspiration.    Following is a summary of the muscles injected  And the amount of Botulinum toxin used:  Dilution 200 units of Botox was reconstituted with 4 ml of preservative free normal saline. Time of reconstitution: At the time of the office visit (<30 minutes prior to injection)   Injections  155 total units of Botox was injected with a 30 gauge needle.  Injection Sites: L occipitalis: 15 units- 3 sites  R occiptalis: 15 units- 3 sites  L upper trapezius: 15 units- 3 sites R upper trapezius: 15 units- 3 sits          L paraspinal: 10 units- 2 sites R paraspinal: 10 units- 2 sites  Face L frontalis(2 injection sites):10 units   R frontalis(2 injection sites):10 units         L corrugator: 5 units   R corrugator: 5 units           Procerus: 5 units   L temporalis: 20 units R temporalis: 20 units   Agent:  200 units of botulinum Type  A (Onobotulinum Toxin type A) was reconstituted with 4 ml of preservative free normal saline.  Time of reconstitution: At the time of the office visit (<30 minutes prior to injection)     Total injected (Units): 155  Total wasted (Units): 45  Patient tolerated procedure well without complications.   Reinjection is anticipated in 3 months. Return to clinic in 2 months   

## 2020-12-09 NOTE — Procedures (Deleted)
Botulinum Clinic   Procedure Note Botox  Attending: Dr. Shon Millet  Preoperative Diagnosis(es): Chronic migraine  Consent obtained from: The patient Benefits discussed included, but were not limited to decreased muscle tightness, increased joint range of motion, and decreased pain.  Risk discussed included, but were not limited pain and discomfort, bleeding, bruising, excessive weakness, venous thrombosis, muscle atrophy and dysphagia.  Anticipated outcomes of the procedure as well as he risks and benefits of the alternatives to the procedure, and the roles and tasks of the personnel to be involved, were discussed with the patient, and the patient consents to the procedure and agrees to proceed. A copy of the patient medication guide was given to the patient which explains the blackbox warning.  Patients identity and treatment sites confirmed Yes.  .  Details of Procedure: Skin was cleaned with alcohol. Prior to injection, the needle plunger was aspirated to make sure the needle was not within a blood vessel.  There was no blood retrieved on aspiration.    Following is a summary of the muscles injected  And the amount of Botulinum toxin used:  Dilution 200 units of Botox was reconstituted with 4 ml of preservative free normal saline. Time of reconstitution: At the time of the office visit (<30 minutes prior to injection)   Injections  155 total units of Botox was injected with a 30 gauge needle.  Injection Sites: L occipitalis: 15 units- 3 sites  R occiptalis: 15 units- 3 sites  L upper trapezius: 15 units- 3 sites R upper trapezius: 15 units- 3 sits          L paraspinal: 10 units- 2 sites R paraspinal: 10 units- 2 sites  Face L frontalis(2 injection sites):10 units   R frontalis(2 injection sites):10 units         L corrugator: 5 units   R corrugator: 5 units           Procerus: 5 units   L temporalis: 20 units R temporalis: 20 units   Agent:  200 units of botulinum Type  A (Onobotulinum Toxin type A) was reconstituted with 4 ml of preservative free normal saline.  Time of reconstitution: At the time of the office visit (<30 minutes prior to injection)     Total injected (Units): 155  Total wasted (Units): ***  Patient tolerated procedure well without complications.   Reinjection is anticipated in 3 months. Return to clinic in 2 months.

## 2020-12-09 NOTE — Progress Notes (Signed)
   Covid-19 Vaccination Clinic  Name:  Tysheka Fanguy    MRN: [id number] DOB: [birth year]  12/09/2020  Ms. Swarey was observed post Covid-19 immunization for 15 minutes without incident. She was provided with Vaccine Information Sheet and instruction to access the V-Safe system.   Ms. Spearing was instructed to call 911 with any severe reactions post vaccine: Marland Kitchen Difficulty breathing  . Swelling of face and throat  . A fast heartbeat  . A bad rash all over body  . Dizziness and weakness   Immunizations Administered    Name Date Dose VIS Date Route   Pfizer COVID-19 Vaccine 12/09/2020  3:10 PM 0.3 mL 10/19/2020 Intramuscular   Manufacturer: [employer], [employer]   Lot: [id number]   NDC: [id number]

## 2021-01-25 ENCOUNTER — Ambulatory Visit: Payer: BC Managed Care – PPO | Admitting: Medical

## 2021-02-02 NOTE — Progress Notes (Signed)
NEUROLOGY FOLLOW UP OFFICE NOTE  Marilyn Hunter [id number]   Subjective:  Marilyn Hunter is a 31 year old right-handed female whofollows up for migraines.   UPDATE: Status post 3 rounds of Botox. Increased work-related stress. Intensity:  Few severe Duration:  avg 1 day, longer during menses Frequency:  20 headache days in January  Current NSAIDS:naproxen 557m BID perimenstrual prophylaxis Current analgesics:none Current triptans:Zomig 5101mNS Current ergotamine:none Current anti-emetic:Zofran ODT 66m69more effective) Current muscle relaxants:none Current anti-anxiolytic:none Current sleep aide:none Current Antihypertensive medications:none Current Antidepressant medications:none Current Anticonvulsant medications:none Current anti-CGRP:Nurtec (rescue) Current Vitamins/Herbal/Supplements:none Current Antihistamines/Decongestants:none Other therapy:Botox Hormone/birth control:none  Caffeine:2 cups of coffee daily. No soda Alcohol:no Smoker:no Diet:At least 60 oz water daily. No soda. Does not skip meals. Exercise:Not routine Depression:no; Anxiety:Sometimes feels overwhelmed. Other pain:no Sleep hygiene:Poor. May get 2-3 hours when the migraine is severe.  HISTORY: She has had migrainesin her early 20s79shey are typically severe pulsating pain behind the right eye but has diffuse head pressure.Associated with Spots in her visual field, sensation of whole-body pressure, dizziness, photophobia, phonophobia, osmophobia, nausea, vomiting, .Once she starts vomiting, she cannot stop. Triggers include cheese, almonds, alcohol, cigarette smoke, strong perfumes and menstrual cycle. Relieving factors include quiet environment, ice pack, apply pressure on eye. They have been uncontrolled and chronic for many years. In 2017, she started getting ovarian cysts and had multiple menstrual cycles a month. This led to  increased migraine frequency. She was going to be evaluated for endometriosis but then became pregnant. She had her son in January 2019 and breast-fed for about a year. At that time, she would have a migraine about once a month. This past summer 2020, they have become worse. In January 2021, she then started having a new associated symptom, sensation of right sided numbness behind her right eye and down right side of her throat. They have been lasting 2 weeks with maybe 2 days of headache freedom in between attacks.  MRI of brain with and without contrast on 02/11/2020 was normal.   Past NSAIDS/steroids:Ibuprofen 800m54maproxen; diclofenac tablet; ketorolac 10mg32mlet; prednisone Past analgesics:Excedrin Migraine; tramadol; Tylenol Past abortive triptans:Frova 2.5mg; 83malt; Relpax; sumatriptan po/NS Past abortive ergotamine:none Past muscle relaxants:Robaxin Past anti-emetic:promethazine 25mg P42mst antihypertensive medications:none Past antidepressant medications:Amitriptyline; nortriptyline Past anticonvulsant medications:topiramate Past anti-CGRP:Aimovig 70mg mo93my;Ubrelvy 100mg(hea35me resolves in 2 hours but returns in around 8 hours) Past vitamins/Herbal/Supplements:none Past antihistamines/decongestants:none Other past therapies:none   Family history of headache:Maternal aunt (migraines); maternal grandmother (migraines)  PAST MEDICAL HISTORY: Past Medical History:  Diagnosis Date  . Abnormal Pap smear 2006   ASCUS, pos HR HPV  . Allergic rhinitis, cause unspecified   . Common migraine with intractable migraine 04/03/2017  . Migraines    chronic  . Ovarian cyst   . Vaginal Pap smear, abnormal     MEDICATIONS: Current Outpatient Medications on File Prior to Visit  Medication Sig Dispense Refill  . acetaminophen (TYLENOL) 500 MG tablet Take 500 mg by mouth every 6 (six) hours as needed. (Patient not taking: Reported on  10/12/2020)    . frovatriptan (FROVA) 2.5 MG tablet Take 1 tablet (2.5 mg total) by mouth as needed for migraine. If recurs, may repeat after 2 hours. Max of 3 tabs in 24 hours. (Patient not taking: Reported on 10/12/2020) 10 tablet 1  . naproxen sodium (ANAPROX) 550 MG tablet Take 1 tablet twice daily beginning 7 days prior to period and continue daily for 14 days. 28 tablet 5  . ondansetron (ZOFRAN ODT)  8 MG disintegrating tablet Take 1 tablet (8 mg total) by mouth every 8 (eight) hours as needed for nausea or vomiting. 20 tablet 5  . promethazine (PHENERGAN) 25 MG suppository Place 1 suppository (25 mg total) rectally every 6 (six) hours as needed for nausea or vomiting. (Patient not taking: Reported on 10/12/2020) 12 each 0  . promethazine (PHENERGAN) 25 MG tablet Take 25 mg by mouth every 6 (six) hours as needed for nausea or vomiting. (Patient not taking: Reported on 10/12/2020)    . Rimegepant Sulfate (NURTEC) 75 MG TBDP Take 1 tablet by mouth daily as needed (Maximum 1 tablet in 24 hours). 8 tablet 11  . Ubrogepant (UBRELVY) 100 MG TABS Take 1 tablet by mouth as directed. When you get an aura and may repeat in 2 hours 10 tablet 1   No current facility-administered medications on file prior to visit.    ALLERGIES: No Known Allergies  FAMILY HISTORY: Family History  Problem Relation Age of Onset  . Hodgkin's lymphoma Mother 77  . Thyroid cancer Mother        (from radiation/hodgkin's treatment)  . Breast cancer Mother 39       possibly related to radiation treatment for hodgkins  BRCA test is negative  . Cancer Mother        hodgkin's; thyroid; breast; now new tumor in thoracic spine from possible breast cancer.  . Cervical cancer Maternal Aunt 53       stage III - clinical trial - BRCA negative  . Hyperlipidemia Maternal Grandfather   . Hypertension Maternal Grandmother   . Autism Cousin   . Diabetes Neg Hx   . Heart disease Neg Hx     SOCIAL HISTORY: Social History    Socioeconomic History  . Marital status: Married    Spouse name: Not on file  . Number of children: 0  . Years of education: Doctorate  . Highest education level: Not on file  Occupational History  . Occupation: Insurance underwriter: [employer]  Tobacco Use  . Smoking status: Never Smoker  . Smokeless tobacco: Never Used  Vaping Use  . Vaping Use: Never used  Substance and Sexual Activity  . Alcohol use: Not Currently  . Drug use: No  . Sexual activity: Yes    Partners: Male    Birth control/protection: Condom    Comment: paragard inserted 05/11/15, removed 11/2016  Other Topics Concern  . Not on file  Social History Narrative   Works as a Chief Executive Officer, passed bar exam in 2017. Lives with her husband.   Right-handed   Caffeine: 2-3 cups of coffee per day   Social Determinants of Health   Financial Resource Strain: Not on file  Food Insecurity: Not on file  Transportation Needs: Not on file  Physical Activity: Not on file  Stress: Not on file  Social Connections: Not on file  Intimate Partner Violence: Not on file     Objective:  Blood pressure 100/78, pulse 90, height 5' 7" (1.702 m), weight 181 lb 9.6 oz (82.4 kg), SpO2 98 %, unknown if currently breastfeeding. General: No acute distress.  Patient appears well-groomed.     Assessment/Plan:   Chronic migraine without aura, without status migrainosus, intractable.  Not responding to Botox.  1.  Migraine prevention:  Stop Botox.  Start Emgality every 28 days 2.  Migraine rescue:  Stop Zomig NS.  She will try samples of Trudhesa NS with Zofran 73m ODT.  If ineffective, will  try Zembrace SymTouch vs Sprix NS 3.  Nurtec daily for 8 days beginning 2 days prior to first day of menses 4.  Limit use of pain relievers to no more than 2 days out of week to prevent risk of rebound or medication-overuse headache. 5.  Keep headache diary 6.  Follow up 4 to 6 months.  Metta Clines, DO  CC: Jill Alexanders, MD

## 2021-02-06 ENCOUNTER — Encounter: Payer: Self-pay | Admitting: Neurology

## 2021-02-06 ENCOUNTER — Other Ambulatory Visit: Payer: Self-pay

## 2021-02-06 ENCOUNTER — Ambulatory Visit: Payer: BC Managed Care – PPO | Admitting: Neurology

## 2021-02-06 VITALS — BP 100/78 | HR 90 | Ht 67.0 in | Wt 181.6 lb

## 2021-02-06 DIAGNOSIS — G43719 Chronic migraine without aura, intractable, without status migrainosus: Secondary | ICD-10-CM | POA: Diagnosis not present

## 2021-02-06 MED ORDER — EMGALITY 120 MG/ML ~~LOC~~ SOAJ: 120.0000 mg | SUBCUTANEOUS | 5 refills | Status: DC

## 2021-02-06 MED ORDER — ONDANSETRON 8 MG PO TBDP: 8.0000 mg | ORAL_TABLET | Freq: Three times a day (TID) | ORAL | 5 refills | Status: DC | PRN

## 2021-02-06 NOTE — Patient Instructions (Signed)
1.  STOP BOTOX AND ZOMIG NASAL SPRAY 2.  Start Emgality - 2 injections now but then just 1 injection every 28 days 3.  Earliest onset of migraine, take Trudhesa nasal spray, 1 spray each nostril.  May repeat once in one hour.  If effective, contact me for prescription. 4.  Zofran for nausea 5.  Beginning 2 days prior to menses, take Nurtec daily for 8 days. 6.  Follow up in 4 to 6 months.

## 2021-02-07 ENCOUNTER — Encounter: Payer: Self-pay | Admitting: Neurology

## 2021-02-07 NOTE — Progress Notes (Signed)
[facility] - Rx #: [id number] Need help? Call us at [phone] Outcome Approvedtoday Effective from 02/06/2021 through 04/30/2021. Drug Emgality 120MG /ML auto-injectors (migraine) Form Blue Form (CB) Original Claim Info 75

## 2021-02-26 ENCOUNTER — Telehealth: Payer: Self-pay

## 2021-02-26 NOTE — Telephone Encounter (Signed)
Recv'd renewal P.A. AIMOVIG looks like pt is no longer on this medication

## 2021-03-13 DIAGNOSIS — G43711 Chronic migraine without aura, intractable, with status migrainosus: Secondary | ICD-10-CM | POA: Diagnosis not present

## 2021-03-14 NOTE — Progress Notes (Signed)
PATIENT HAS A BOTOX IN THE FRIDGE BUT NO APPT SCHEDULED!

## 2021-03-17 ENCOUNTER — Ambulatory Visit: Payer: BC Managed Care – PPO | Admitting: Neurology

## 2021-06-23 ENCOUNTER — Telehealth: Payer: Self-pay | Admitting: Neurology

## 2021-06-23 NOTE — Telephone Encounter (Signed)
Marilyn Hunter (KeyGwinda Passe) - [id number] Emgality 120MG /ML auto-injectors (migraine)     Status: PA Request  Created: June 21st, 2022 June 23th, 2022  Sent: June 24th, 2022  Waiting on reply

## 2021-07-05 NOTE — Telephone Encounter (Signed)
Emgality Approved effective 06/23/2021-06/22/22

## 2021-07-25 IMAGING — MR MR HEAD WO/W CM
12 series · 48 of 48 positions shown · IV contrast (multihance)
Comparison: No pertinent prior studies available for comparison.

CLINICAL DATA: Headache,, chronic, no new features. Additional
history provided by technologist: Patient reports migraines with
numbness at the back of her head and around her mouth; migraines for
several years, worsening recently.

EXAM:
MRI HEAD WITHOUT AND WITH CONTRAST
TECHNIQUE: Multiplanar, multiecho pulse sequences of the brain and surrounding
structures were obtained without and with intravenous contrast.
CONTRAST:  15mL MULTIHANCE GADOBENATE DIMEGLUMINE 529 MG/ML IV SOLN

[Series 2: T1 · sagittal · 5.0mm · 0.45mm/px · 1 of 24 slices shown]
[im 1/24]
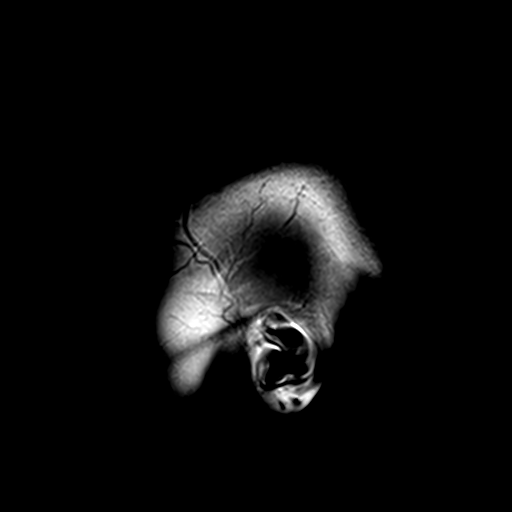

[Series 3: DWI · axial · 3.0mm · 1.80mm/px · z∈[-69,+78]mm · 7 of 100 slices shown (1 of 4)]
[im 1/100]
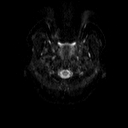
[im 17/100]
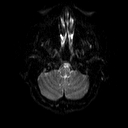
[im 34/100]
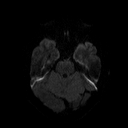
[im 50/100]
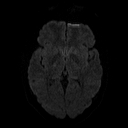
[im 67/100]
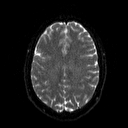
[im 83/100]
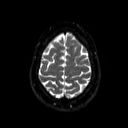
[im 100/100]
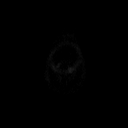

[Series 4: DWI · axial · 3.0mm · 1.80mm/px · z∈[-69,+78]mm · 3 of 50 slices shown (2 of 4)]
[im 1/50]
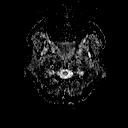
[im 25/50]
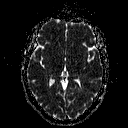
[im 50/50]
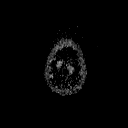

[Series 7: T2 · axial · 5.0mm · 0.51mm/px · 1 of 22 slices shown (1 of 2)]
[im 1/22]
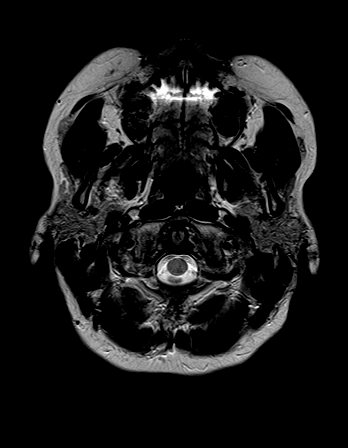

[Series 8: DWI · coronal · 5.0mm · 1.80mm/px · 5 of 76 slices shown (3 of 4)]
[im 1/76]
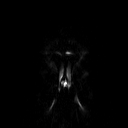
[im 19/76]
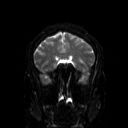
[im 38/76]
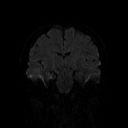
[im 57/76]
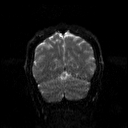
[im 76/76]
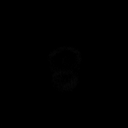

[Series 9: DWI · coronal · 5.0mm · 1.80mm/px · 3 of 38 slices shown (4 of 4)]
[im 1/38]
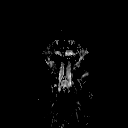
[im 19/38]
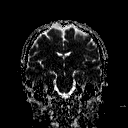
[im 38/38]
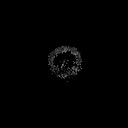

[Series 10: FLAIR · axial · 3.0mm · 0.45mm/px · z∈[-63,+72]mm · 2 of 30 slices shown]
[im 1/30]
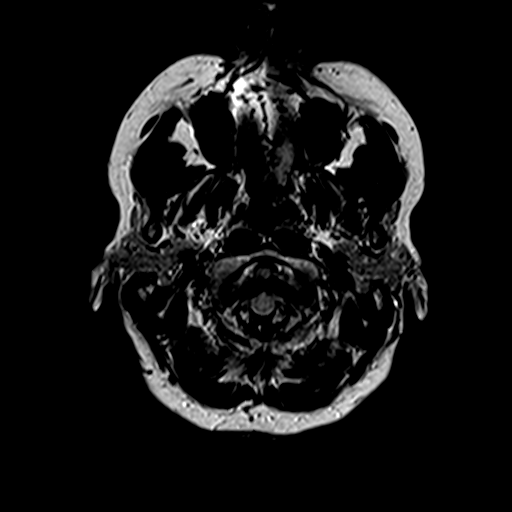
[im 30/30]
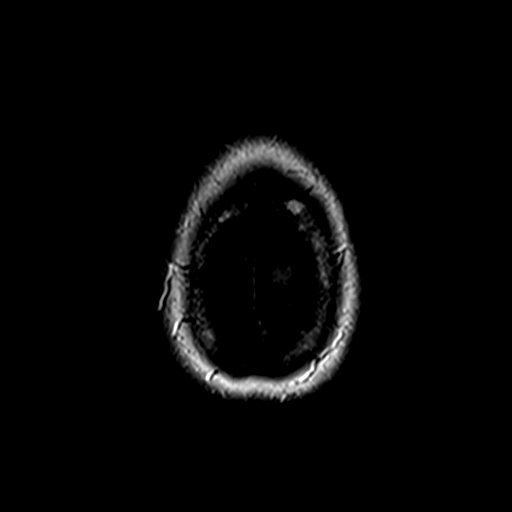

[Series 12: swi_images · axial · 4.0mm · 0.90mm/px · z∈[-65,+75]mm · 2 of 36 slices shown]
[im 1/36]
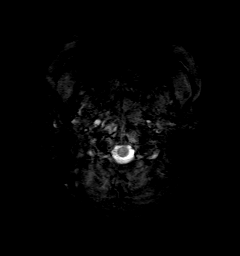
[im 36/36]
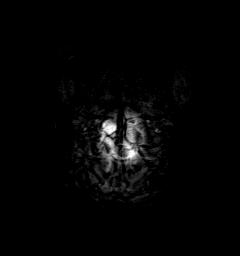

[Series 13: t1_mpr_tra · axial · 1.0mm · 0.75mm/px · z∈[-66,+76]mm · 10 of 144 slices shown (1 of 2)]
[im 1/144]
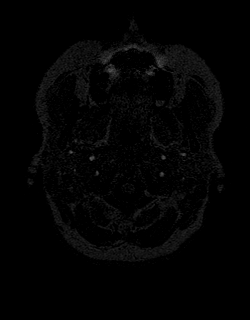
[im 16/144]
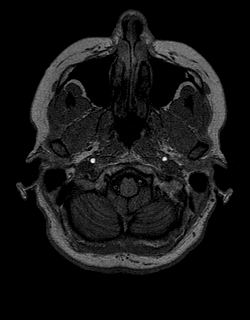
[im 32/144]
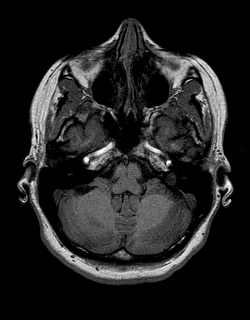
[im 48/144]
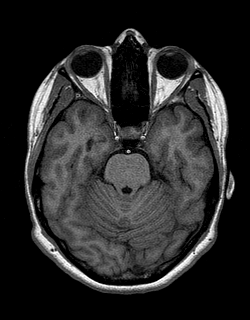
[im 64/144]
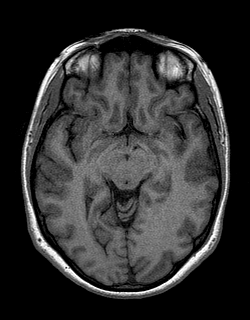
[im 80/144]
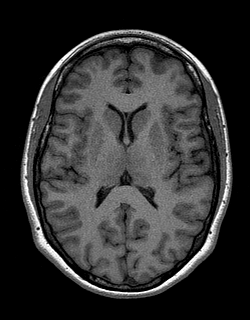
[im 96/144]
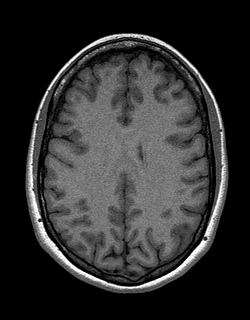
[im 112/144]
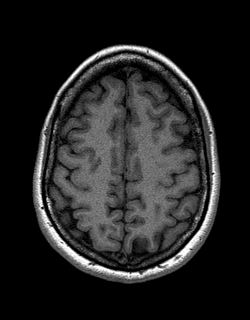
[im 128/144]
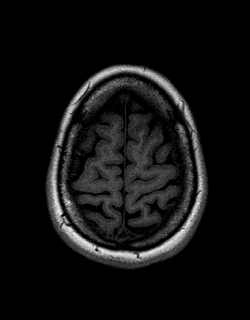
[im 144/144]
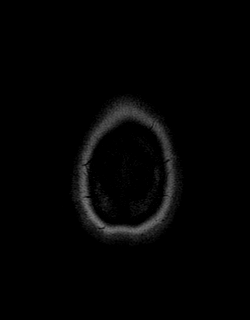

[Series 14: T2 · coronal · 5.0mm · 0.45mm/px · 2 of 29 slices shown (2 of 2)]
[im 1/29]
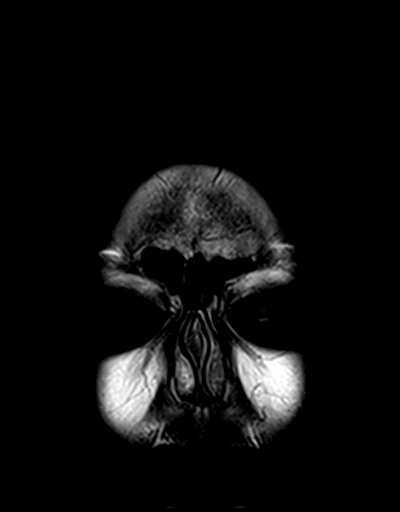
[im 29/29]
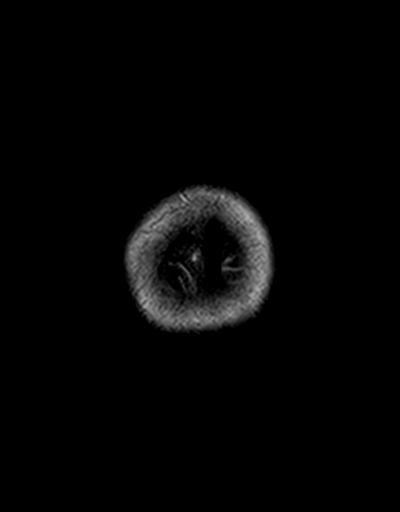

[Series 15: t1_mpr_tra · axial · 1.0mm · 0.75mm/px · z∈[-66,+76]mm · 10 of 144 slices shown (2 of 2)]
[im 1/144]
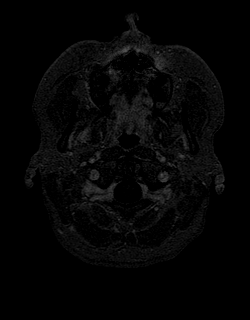
[im 16/144]
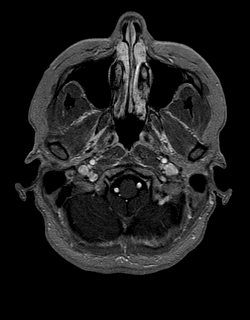
[im 32/144]
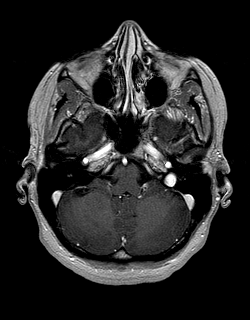
[im 48/144]
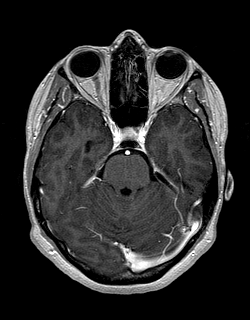
[im 64/144]
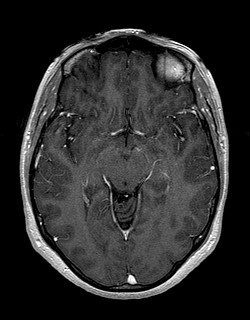
[im 80/144]
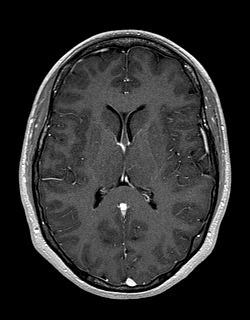
[im 96/144]
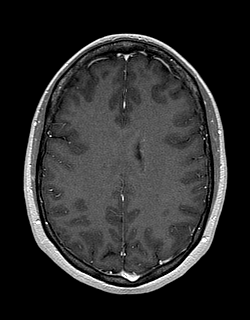
[im 112/144]
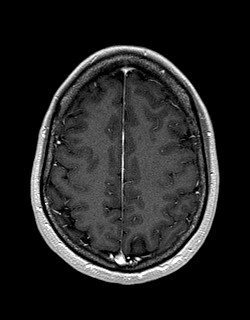
[im 128/144]
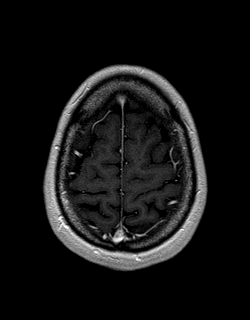
[im 144/144]
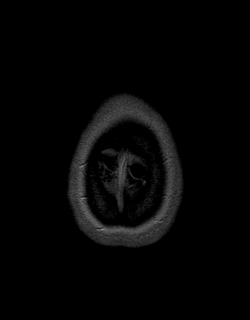

[Series 16: post cor · coronal · 5.0mm · 0.45mm/px · 2 of 29 slices shown]
[im 1/29]
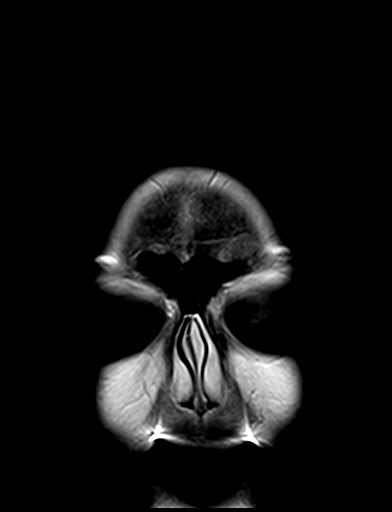
[im 29/29]
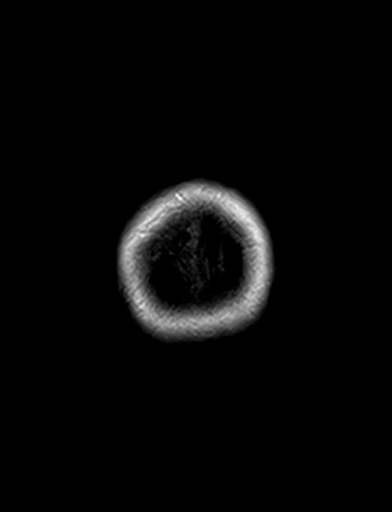

[48 of 48 positions shown; findings below may reference images not displayed]

FINDINGS: Brain:

There is no evidence of acute infarct.

No evidence of intracranial mass.

No midline shift or extra-axial fluid collection.

No chronic intracranial blood products.

No focal parenchymal signal abnormality or abnormal intracranial
enhancement is identified.

Cerebral volume is normal for age.

Vascular: Flow voids maintained within the proximal large arterial
vessels. Expected enhancement within the proximal large arterial
vessels and dural venous sinuses.

Skull and upper cervical spine: No focal marrow lesion.

Sinuses/Orbits: Visualized orbits demonstrate no acute abnormality.
Trace ethmoid sinus mucosal thickening. No significant mastoid
effusion.
IMPRESSION: Normal MRI appearance of the brain. No evidence of acute
intracranial abnormality.

## 2021-08-10 ENCOUNTER — Ambulatory Visit: Payer: BC Managed Care – PPO | Admitting: Neurology

## 2021-08-14 NOTE — Progress Notes (Signed)
NEUROLOGY FOLLOW UP OFFICE NOTE  Marilyn Hunter [id number]  Assessment/Plan:   Chronic migraine without aura, without status migrainosus, not intractable/menstrual related migraine  Migraine prevention:  Emgality Perimenstrual prophylaxis:  Nurtec daily for 8 days beginning 2 days prior to first day of menses Migraine rescue:  She will try Elyxyb with naproxen.  If effective, will send prescription.  Otherwise, will send prescription for Trudhesa NS Limit use of pain relievers to no more than 2 days out of week to prevent risk of rebound or medication-overuse headache. Keep headache diary Follow up 6 months.   Subjective:  Marilyn Hunter is a 31 year old right-handed female who follows up for migraines.    UPDATE: Stopped Botox and started Emgality in February. She was also instructed to take Nurtec daily for 8 days beginning 2 days prior to first day of menses.  She notes improvement.  Trudhesa allso helpful - migraine lasts a few hours where otherwise it would last all day. Intensity:  Few severe Duration:  avg 1 day with Nurtec and naproxen (a few hours with Trudhesa) Frequency:  5-6 days a month (only one day during menses)   Current NSAIDS:  naproxen 549m BID perimenstrual prophylaxis Current analgesics:  none Current triptans:  none Current ergotamine:  Trudhesa (tried sample) Current anti-emetic:  Zofran ODT 812m promethazine 2559mR Current muscle relaxants:  none Current anti-anxiolytic:  none Current sleep aide:  none Current Antihypertensive medications:  none Current Antidepressant medications:  none Current Anticonvulsant medications:  none Current anti-CGRP:  Nurtec (menstrual mini-prophylaxis) Current Vitamins/Herbal/Supplements:  none Current Antihistamines/Decongestants:  none Other therapy:  Botox Hormone/birth control:  none   Caffeine:  2 cups of coffee daily.  No soda Alcohol:  no Smoker:  no Diet:  At least 60 oz water daily.  No soda.  Does  not skip meals. Exercise:  Not routine Depression:  no; Anxiety:  Sometimes feels overwhelmed. Other pain:  no Sleep hygiene:  Poor.  May get 2-3 hours when the migraine is severe.   HISTORY:  She has had migraines in her early 20s54sThey are typically severe pulsating pain behind the right eye but has diffuse head pressure.  Associated with  Spots in her visual field, sensation of whole-body pressure, dizziness, photophobia, phonophobia, osmophobia, nausea, vomiting, .Once she starts vomiting, she cannot stop.  Triggers include cheese, almonds, alcohol, cigarette smoke, strong perfumes and menstrual cycle.  Relieving factors include quiet environment, ice pack, apply pressure on eye.  They have been uncontrolled and chronic for many years.  In 2017, she started getting ovarian cysts and had multiple menstrual cycles a month.  This led to increased migraine frequency.  She was going to be evaluated for endometriosis but then became pregnant.  She had her son in January 2019 and breast-fed for about a year.  At that time, she would have a migraine about once a month.  This past summer 2020, they have become worse.  In January 2021, she then started having a new associated symptom, sensation of right sided numbness behind her right eye and down right side of her throat.  They have been lasting 2 weeks with maybe 2 days of headache freedom in between attacks.   MRI of brain with and without contrast on 02/11/2020 was normal.     Past NSAIDS/steroids:  Ibuprofen 800m70maproxen; diclofenac tablet; ketorolac 10mg65mlet; prednisone Past analgesics:  Excedrin Migraine; tramadol; Tylenol Past abortive triptans:  Frova 2.5mg; 71malt; Relpax; sumatriptan po/NS, Zomig 5mg23m  NS Past abortive ergotamine:  none Past muscle relaxants:  Robaxin Past anti-emetic:  promethazine 41m PR Past antihypertensive medications:  none Past antidepressant medications:  Amitriptyline; nortriptyline Past anticonvulsant  medications:  topiramate Past anti-CGRP:  Aimovig 718mmonthly; Ubrelvy 10076mheadache resolves in 2 hours but returns in around 8 hours) Past vitamins/Herbal/Supplements:  none Past antihistamines/decongestants:  none Other past therapies:  Botox     Family history of headache:  Maternal aunt (migraines); maternal grandmother (migraines)  PAST MEDICAL HISTORY: Past Medical History:  Diagnosis Date   Abnormal Pap smear 2006   ASCUS, pos HR HPV   Allergic rhinitis, cause unspecified    Common migraine with intractable migraine 04/03/2017   Migraines    chronic   Ovarian cyst    Vaginal Pap smear, abnormal     MEDICATIONS: Current Outpatient Medications on File Prior to Visit  Medication Sig Dispense Refill   acetaminophen (TYLENOL) 500 MG tablet Take 500 mg by mouth every 6 (six) hours as needed. (Patient not taking: No sig reported)     Galcanezumab-gnlm (EMGALITY) 120 MG/ML SOAJ Inject 120 mg into the skin every 28 (twenty-eight) days. 1.12 mL 5   naproxen sodium (ANAPROX) 550 MG tablet Take 1 tablet twice daily beginning 7 days prior to period and continue daily for 14 days. 28 tablet 5   ondansetron (ZOFRAN ODT) 8 MG disintegrating tablet Take 1 tablet (8 mg total) by mouth every 8 (eight) hours as needed for nausea or vomiting. 20 tablet 5   promethazine (PHENERGAN) 25 MG suppository Place 1 suppository (25 mg total) rectally every 6 (six) hours as needed for nausea or vomiting. (Patient not taking: No sig reported) 12 each 0   promethazine (PHENERGAN) 25 MG tablet Take 25 mg by mouth every 6 (six) hours as needed for nausea or vomiting. (Patient not taking: No sig reported)     Rimegepant Sulfate (NURTEC) 75 MG TBDP Take 1 tablet by mouth daily as needed (Maximum 1 tablet in 24 hours). 8 tablet 11   No current facility-administered medications on file prior to visit.    ALLERGIES: No Known Allergies  FAMILY HISTORY: Family History  Problem Relation Age of Onset    Hodgkin's lymphoma Mother 22 39Thyroid cancer Mother        (from radiation/hodgkin's treatment)   Breast cancer Mother 44 74    possibly related to radiation treatment for hodgkins  BRCA test is negative   Cancer Mother        hodgkin's; thyroid; breast; now new tumor in thoracic spine from possible breast cancer.   Cervical cancer Maternal Aunt 53       stage III - clinical trial - BRCA negative   Hyperlipidemia Maternal Grandfather    Hypertension Maternal Grandmother    Autism Cousin    Diabetes Neg Hx    Heart disease Neg Hx       Objective:  Blood pressure 118/79, pulse 89, height 5' 7.5" (1.715 m), weight 191 lb (86.6 kg), SpO2 97 %, unknown if currently breastfeeding. General: No acute distress.  Patient appears well-groomed.   Head:  Normocephalic/atraumatic Eyes:  Fundi examined but not visualized Neck: supple, no paraspinal tenderness, full range of motion Heart:  Regular rate and rhythm Lungs:  Clear to auscultation bilaterally Back: No paraspinal tenderness Neurological Exam: alert and oriented to person, place, and time.  Speech fluent and not dysarthric, language intact.  CN II-XII intact. Bulk and tone normal, muscle strength 5/5 throughout.  Sensation to light touch intact.  Deep tendon reflexes 2+ throughout, toes downgoing.  Finger to nose testing intact.  Gait normal, Romberg negative.   Metta Clines, DO  CC: Jill Alexanders, MD

## 2021-08-15 ENCOUNTER — Other Ambulatory Visit: Payer: Self-pay

## 2021-08-15 ENCOUNTER — Encounter: Payer: Self-pay | Admitting: Neurology

## 2021-08-15 ENCOUNTER — Ambulatory Visit (INDEPENDENT_AMBULATORY_CARE_PROVIDER_SITE_OTHER): Payer: BC Managed Care – PPO | Admitting: Neurology

## 2021-08-15 VITALS — BP 118/79 | HR 89 | Ht 67.5 in | Wt 191.0 lb

## 2021-08-15 DIAGNOSIS — G43829 Menstrual migraine, not intractable, without status migrainosus: Secondary | ICD-10-CM | POA: Diagnosis not present

## 2021-08-15 NOTE — Progress Notes (Signed)
Medication Samples have been provided to the patient.  Drug name: elyxyb        Qty: 2 LOT: [id number]  Exp.Date: 01/2024  Dosing instructions: take as needed  The patient has been instructed regarding the correct time, dose, and frequency of taking this medication, including desired effects and most common side effects.

## 2021-08-15 NOTE — Patient Instructions (Addendum)
  Emgality every 28 days When you get a migraine, take Elyxyb once with Nurtec.  If effective, contact me for prescription.  If not effective, contact me for prescription of the Trudhesa nasal spray. Take  Beginning 2 days prior to menses, take Nurtec daily for 8 days. Limit use of pain relievers to no more than 2 days out of the week.  These medications include acetaminophen, NSAIDs (ibuprofen/Advil/Motrin, naproxen/Aleve, triptans (Imitrex/sumatriptan), Excedrin, and narcotics.  This will help reduce risk of rebound headaches. Be aware of common food triggers:  - Caffeine:  coffee, black tea, cola, Mt. Dew  - Chocolate  - Dairy:  aged cheeses (brie, blue, cheddar, gouda, [place], provolone, [place], Swiss, etc), chocolate milk, buttermilk, sour cream, limit eggs and yogurt  - Nuts, peanut butter  - Alcohol  - Cereals/grains:  FRESH breads (fresh bagels, sourdough, doughnuts), yeast productions  - Processed/canned/aged/cured meats (pre-packaged deli meats, hotdogs)  - MSG/glutamate:  soy sauce, flavor enhancer, pickled/preserved/marinated foods  - Sweeteners:  aspartame (Equal, Nutrasweet).  Sugar and Splenda are okay  - Vegetables:  legumes (lima beans, lentils, snow peas, fava beans, pinto peans, peas, garbanzo beans), sauerkraut, onions, olives, pickles  - Fruit:  avocados, bananas, citrus fruit (orange, lemon, grapefruit), mango  - Other:  Frozen meals, macaroni and cheese Routine exercise Stay adequately hydrated (aim for 64 oz water daily) Keep headache diary Maintain proper stress management Maintain proper sleep hygiene Do not skip meals Consider supplements:  magnesium citrate 400mg  daily, riboflavin 400mg  daily, coenzyme Q10 100mg  three times daily.

## 2021-09-18 ENCOUNTER — Other Ambulatory Visit: Payer: Self-pay

## 2021-09-18 ENCOUNTER — Encounter: Payer: Self-pay | Admitting: Family Medicine

## 2021-09-18 ENCOUNTER — Telehealth: Payer: BC Managed Care – PPO | Admitting: Family Medicine

## 2021-09-18 VITALS — BP 118/79 | Wt 191.0 lb

## 2021-09-18 DIAGNOSIS — J209 Acute bronchitis, unspecified: Secondary | ICD-10-CM

## 2021-09-18 MED ORDER — AZITHROMYCIN 500 MG PO TABS: 500.0000 mg | ORAL_TABLET | Freq: Every day | ORAL | 0 refills | Status: DC

## 2021-09-18 MED ORDER — BENZONATATE 100 MG PO CAPS: 200.0000 mg | ORAL_CAPSULE | Freq: Two times a day (BID) | ORAL | 0 refills | Status: DC | PRN

## 2021-09-18 NOTE — Progress Notes (Signed)
   Subjective:    Patient ID: Marilyn Hunter, female    DOB: Jun 29, 1990, 31 y.o.   MRN: [id number]  HPI Documentation for virtual audio and video telecommunications through [place] encounter:  The patient was located at home. 2 patient identifiers used.  The provider was located in the office. The patient did consent to this visit and is aware of possible charges through their insurance for this visit. The other persons participating in this telemedicine service were none. Time spent on call was 5 minutes and in review of previous records >15 minutes total for counseling and coordination of care. This virtual service is not related to other E/M service within previous 7 days.  She states that 1 week ago she woke up with a sore throat and a dry cough with fatigue and malaise.  The cough over the last several days has become productive.  She has tested herself twice for COVID and was negative.  No fever, chills, earache or shortness of breath.  Review of Systems     Objective:   Physical Exam Alert and in no distress otherwise not examined       Assessment & Plan:  Acute bronchitis, unspecified organism - Plan: azithromycin (ZITHROMAX) 500 MG tablet, benzonatate (TESSALON) 100 MG capsule I think she has acute bronchitis and will respond nicely to antibiotics.  I will call that in.  She will call if further difficulty.  Explained the fact that the azithromycin lasts at least a week.

## 2021-10-16 ENCOUNTER — Telehealth: Payer: Self-pay

## 2021-10-16 NOTE — Telephone Encounter (Signed)
New message   AZELYN BATIE (Key: [id number]) Nurtec 75MG  dispersible tablets   Form Blue Form (CB) Created 2 days ago Sent to Plan 10 minutes ago Plan Response 10 minutes ago Submit Clinical Questions less than a minute ago Determination Wait for Determination Please wait for Advertising account executive to return a determination.  Your information has been submitted to [facility]. [employer] will review the request and notify you of the determination decision directly, typically within 72 hours of receiving all information.  You will also receive your request decision electronically. To check for an update later, open this request again from your dashboard.  If [facility] has not responded within the specified timeframe or if you have any questions about your PA submission, contact [employer] [place] directly at [phone].

## 2021-10-18 NOTE — Telephone Encounter (Signed)
F/u   Marilyn Hunter (Key: [id number]) Nurtec 75MG  dispersible tablets   Form Blue Form (CB) Created 4 days ago Sent to Plan 2 days ago Plan Response 2 days ago Submit Clinical Questions 2 days ago Determination Favorable 3 hours ago Message from Plan Effective from 10/16/2021 through 10/15/2022. Approved Nurtec ODT 75mg  strength, 15 pills every 30 days (45 every 90 days)

## 2021-10-23 NOTE — Telephone Encounter (Signed)
F/u   Received fax from [facility]  Effective date of the authorization 10/16/2021 to 10/15/2022  Nurtec 75 mg approved for 15 tablets every 30 days

## 2022-01-30 ENCOUNTER — Encounter: Payer: Self-pay | Admitting: Family Medicine

## 2022-02-23 NOTE — Progress Notes (Unsigned)
NEUROLOGY FOLLOW UP OFFICE NOTE  Marilyn Hunter [id number]  Assessment/Plan:   Chronic migraine without aura, without status migrainosus, not intractable/menstrual related migraine   Migraine prevention:  Emgality Perimenstrual prophylaxis:  Nurtec daily for 8 days beginning 2 days prior to first day of menses Migraine rescue:  She will try Elyxyb with naproxen.  If effective, will send prescription.  Otherwise, will send prescription for Trudhesa NS Limit use of pain relievers to no more than 2 days out of week to prevent risk of rebound or medication-overuse headache. Keep headache diary Follow up 6 months.     Subjective:  Marilyn Hunter is a 32 year old right-handed female who follows up for migraines.    UPDATE: Intensity:  Few severe Duration:  avg 1 day with Nurtec and naproxen (a few hours with Trudhesa) Frequency:  5-6 days a month (only one day during menses)   Current NSAIDS:  naproxen 551m BID perimenstrual prophylaxis Current analgesics:  none Current triptans:  none Current ergotamine:  Trudhesa (tried sample) Current anti-emetic:  Zofran ODT 823m promethazine 2560mR Current muscle relaxants:  none Current anti-anxiolytic:  none Current sleep aide:  none Current Antihypertensive medications:  none Current Antidepressant medications:  none Current Anticonvulsant medications:  none Current anti-CGRP:  Nurtec (menstrual mini-prophylaxis) Current Vitamins/Herbal/Supplements:  none Current Antihistamines/Decongestants:  none Other therapy:  Botox Hormone/birth control:  none   Caffeine:  2 cups of coffee daily.  No soda Alcohol:  no Smoker:  no Diet:  At least 60 oz water daily.  No soda.  Does not skip meals. Exercise:  Not routine Depression:  no; Anxiety:  Sometimes feels overwhelmed. Other pain:  no Sleep hygiene:  Poor.  May get 2-3 hours when the migraine is severe.   HISTORY:  She has had migraines in her early 34s75sThey are typically severe  pulsating pain behind the right eye but has diffuse head pressure.  Associated with  Spots in her visual field, sensation of whole-body pressure, dizziness, photophobia, phonophobia, osmophobia, nausea, vomiting, .Once she starts vomiting, she cannot stop.  Triggers include cheese, almonds, alcohol, cigarette smoke, strong perfumes and menstrual cycle.  Relieving factors include quiet environment, ice pack, apply pressure on eye.  They have been uncontrolled and chronic for many years.  In 2017, she started getting ovarian cysts and had multiple menstrual cycles a month.  This led to increased migraine frequency.  She was going to be evaluated for endometriosis but then became pregnant.  She had her son in January 2019 and breast-fed for about a year.  At that time, she would have a migraine about once a month.  This past summer 2020, they have become worse.  In January 2021, she then started having a new associated symptom, sensation of right sided numbness behind her right eye and down right side of her throat.  They have been lasting 2 weeks with maybe 2 days of headache freedom in between attacks.   MRI of brain with and without contrast on 02/11/2020 was normal.     Past NSAIDS/steroids:  Ibuprofen 800m10maproxen; diclofenac tablet; ketorolac 10mg41mlet; prednisone Past analgesics:  Excedrin Migraine; tramadol; Tylenol Past abortive triptans:  Frova 2.5mg; 2malt; Relpax; sumatriptan po/NS, Zomig 5mg NS39mst abortive ergotamine:  none Past muscle relaxants:  Robaxin Past anti-emetic:  promethazine 25mg PR69mt antihypertensive medications:  none Past antidepressant medications:  Amitriptyline; nortriptyline Past anticonvulsant medications:  topiramate Past anti-CGRP:  Aimovig 70mg mon73m; Ubrelvy 100mg (hea66me resolves in 2  hours but returns in around 8 hours) Past vitamins/Herbal/Supplements:  none Past antihistamines/decongestants:  none Other past therapies:  Botox     Family history  of headache:  Maternal aunt (migraines); maternal grandmother (migraines)  PAST MEDICAL HISTORY: Past Medical History:  Diagnosis Date   Abnormal Pap smear 2006   ASCUS, pos HR HPV   Allergic rhinitis, cause unspecified    Common migraine with intractable migraine 04/03/2017   Migraines    chronic   Ovarian cyst    Vaginal Pap smear, abnormal     MEDICATIONS: Current Outpatient Medications on File Prior to Visit  Medication Sig Dispense Refill   acetaminophen (TYLENOL) 500 MG tablet Take 500 mg by mouth every 6 (six) hours as needed.     azithromycin (ZITHROMAX) 500 MG tablet Take 1 tablet (500 mg total) by mouth daily. 3 tablet 0   benzonatate (TESSALON) 100 MG capsule Take 2 capsules (200 mg total) by mouth 2 (two) times daily as needed for cough. 20 capsule 0   Galcanezumab-gnlm (EMGALITY) 120 MG/ML SOAJ Inject 120 mg into the skin every 28 (twenty-eight) days. 1.12 mL 5   naproxen sodium (ANAPROX) 550 MG tablet Take 1 tablet twice daily beginning 7 days prior to period and continue daily for 14 days. 28 tablet 5   ondansetron (ZOFRAN ODT) 8 MG disintegrating tablet Take 1 tablet (8 mg total) by mouth every 8 (eight) hours as needed for nausea or vomiting. 20 tablet 5   promethazine (PHENERGAN) 25 MG suppository Place 1 suppository (25 mg total) rectally every 6 (six) hours as needed for nausea or vomiting. (Patient not taking: Reported on 09/18/2021) 12 each 0   promethazine (PHENERGAN) 25 MG tablet Take 25 mg by mouth every 6 (six) hours as needed for nausea or vomiting.     Rimegepant Sulfate (NURTEC) 75 MG TBDP Take 1 tablet by mouth daily as needed (Maximum 1 tablet in 24 hours). 8 tablet 11   No current facility-administered medications on file prior to visit.    ALLERGIES: No Known Allergies  FAMILY HISTORY: Family History  Problem Relation Age of Onset   Hodgkin's lymphoma Mother 15   Thyroid cancer Mother        (from radiation/hodgkin's treatment)   Breast cancer  Mother 47       possibly related to radiation treatment for hodgkins  BRCA test is negative   Cancer Mother        hodgkin's; thyroid; breast; now new tumor in thoracic spine from possible breast cancer.   Cervical cancer Maternal Aunt 53       stage III - clinical trial - BRCA negative   Hyperlipidemia Maternal Grandfather    Hypertension Maternal Grandmother    Autism Cousin    Diabetes Neg Hx    Heart disease Neg Hx       Objective:  *** General: No acute distress.  Patient appears ***-groomed.   Head:  Normocephalic/atraumatic Eyes:  Fundi examined but not visualized Neck: supple, no paraspinal tenderness, full range of motion Heart:  Regular rate and rhythm Lungs:  Clear to auscultation bilaterally Back: No paraspinal tenderness Neurological Exam: alert and oriented to person, place, and time.  Speech fluent and not dysarthric, language intact.  CN II-XII intact. Bulk and tone normal, muscle strength 5/5 throughout.  Sensation to light touch intact.  Deep tendon reflexes 2+ throughout, toes downgoing.  Finger to nose testing intact.  Gait normal, Romberg negative.   Metta Clines, DO  CC: ***

## 2022-02-26 ENCOUNTER — Ambulatory Visit: Payer: BC Managed Care – PPO | Admitting: Neurology

## 2022-04-13 NOTE — Progress Notes (Deleted)
? ?NEUROLOGY FOLLOW UP OFFICE NOTE ? ?Marilyn Hunter ?[id number] ? ?Assessment/Plan:  ? ?Chronic migraine without aura, without status migrainosus, not intractable/menstrual related migraine ?  ?Migraine prevention:  Emgality ?Perimenstrual prophylaxis:  Nurtec daily for 8 days beginning 2 days prior to first day of menses ?Migraine rescue:  She will try Elyxyb with naproxen.  If effective, will send prescription.  Otherwise, will send prescription for Trudhesa NS ?Limit use of pain relievers to no more than 2 days out of week to prevent risk of rebound or medication-overuse headache. ?Keep headache diary ?Follow up 6 months. ?  ?  ?Subjective:  ?Marilyn Hunter is a 32 year old right-handed female who follows up for migraines.  ?  ?UPDATE: ?Stopped Botox and started [employer] in February. ?She was also instructed to take Nurtec daily for 8 days beginning 2 days prior to first day of menses.  She notes improvement.  Trudhesa allso helpful - migraine lasts a few hours where otherwise it would last all day. ?Intensity:  Few severe ?Duration:  avg 1 day with Nurtec and naproxen (a few hours with Trudhesa) ?Frequency:  5-6 days a month (only one day during menses) ?  ?Current NSAIDS:  naproxen 527m BID perimenstrual prophylaxis ?Current analgesics:  none ?Current triptans:  none ?Current ergotamine:  TNeysa Hotter(tried sample) ?Current anti-emetic:  Zofran ODT 86m promethazine 2526mR ?Current muscle relaxants:  none ?Current anti-anxiolytic:  none ?Current sleep aide:  none ?Current Antihypertensive medications:  none ?Current Antidepressant medications:  none ?Current Anticonvulsant medications:  none ?Current anti-CGRP:  Nurtec (menstrual mini-prophylaxis) ?Current Vitamins/Herbal/Supplements:  none ?Current Antihistamines/Decongestants:  none ?Other therapy:  Botox ?Hormone/birth control:  none ?  ?Caffeine:  2 cups of coffee daily.  No soda ?Alcohol:  no ?Smoker:  no ?Diet:  At least 60 oz water daily.  No soda.  Does  not skip meals. ?Exercise:  Not routine ?Depression:  no; Anxiety:  Sometimes feels overwhelmed. ?Other pain:  no ?Sleep hygiene:  Poor.  May get 2-3 hours when the migraine is severe. ?  ?HISTORY:  ?She has had migraines in her early 20s21sThey are typically severe pulsating pain behind the right eye but has diffuse head pressure.  Associated with  Spots in her visual field, sensation of whole-body pressure, dizziness, photophobia, phonophobia, osmophobia, nausea, vomiting, .Once she starts vomiting, she cannot stop.  Triggers include cheese, almonds, alcohol, cigarette smoke, strong perfumes and menstrual cycle.  Relieving factors include quiet environment, ice pack, apply pressure on eye.  They have been uncontrolled and chronic for many years.  In 2017, she started getting ovarian cysts and had multiple menstrual cycles a month.  This led to increased migraine frequency.  She was going to be evaluated for endometriosis but then became pregnant.  She had her son in January 2019 and breast-fed for about a year.  At that time, she would have a migraine about once a month.  This past summer 2020, they have become worse.  In January 2021, she then started having a new associated symptom, sensation of right sided numbness behind her right eye and down right side of her throat.  They have been lasting 2 weeks with maybe 2 days of headache freedom in between attacks. ?  ?MRI of brain with and without contrast on 02/11/2020 was normal. ?  ?  ?Past NSAIDS/steroids:  Ibuprofen 800m26maproxen; diclofenac tablet; ketorolac 10mg69mlet; prednisone ?Past analgesics:  Excedrin Migraine; tramadol; Tylenol ?Past abortive triptans:  Frova 2.5mg; 25malt; Relpax; sumatriptan  po/NS, Zomig 39m NS ?Past abortive ergotamine:  none ?Past muscle relaxants:  Robaxin ?Past anti-emetic:  promethazine 234mPR ?Past antihypertensive medications:  none ?Past antidepressant medications:  Amitriptyline; nortriptyline ?Past anticonvulsant  medications:  topiramate ?Past anti-CGRP:  Aimovig 705monthly; Ubrelvy 100m67meadache resolves in 2 hours but returns in around 8 hours) ?Past vitamins/Herbal/Supplements:  none ?Past antihistamines/decongestants:  none ?Other past therapies:  Botox ?  ?  ?Family history of headache:  Maternal aunt (migraines); maternal grandmother (migraines) ? ?PAST MEDICAL HISTORY: ?Past Medical History:  ?Diagnosis Date  ? Abnormal Pap smear 2006  ? ASCUS, pos HR HPV  ? Allergic rhinitis, cause unspecified   ? Common migraine with intractable migraine 04/03/2017  ? Migraines   ? chronic  ? Ovarian cyst   ? Vaginal Pap smear, abnormal   ? ? ?MEDICATIONS: ?Current Outpatient Medications on File Prior to Visit  ?Medication Sig Dispense Refill  ? acetaminophen (TYLENOL) 500 MG tablet Take 500 mg by mouth every 6 (six) hours as needed.    ? azithromycin (ZITHROMAX) 500 MG tablet Take 1 tablet (500 mg total) by mouth daily. 3 tablet 0  ? benzonatate (TESSALON) 100 MG capsule Take 2 capsules (200 mg total) by mouth 2 (two) times daily as needed for cough. 20 capsule 0  ? Galcanezumab-gnlm (EMGALITY) 120 MG/ML SOAJ Inject 120 mg into the skin every 28 (twenty-eight) days. 1.12 mL 5  ? naproxen sodium (ANAPROX) 550 MG tablet Take 1 tablet twice daily beginning 7 days prior to period and continue daily for 14 days. 28 tablet 5  ? ondansetron (ZOFRAN ODT) 8 MG disintegrating tablet Take 1 tablet (8 mg total) by mouth every 8 (eight) hours as needed for nausea or vomiting. 20 tablet 5  ? promethazine (PHENERGAN) 25 MG suppository Place 1 suppository (25 mg total) rectally every 6 (six) hours as needed for nausea or vomiting. (Patient not taking: Reported on 09/18/2021) 12 each 0  ? promethazine (PHENERGAN) 25 MG tablet Take 25 mg by mouth every 6 (six) hours as needed for nausea or vomiting.    ? Rimegepant Sulfate (NURTEC) 75 MG TBDP Take 1 tablet by mouth daily as needed (Maximum 1 tablet in 24 hours). 8 tablet 11  ? ?No current  facility-administered medications on file prior to visit.  ? ? ?ALLERGIES: ?No Known Allergies ? ?FAMILY HISTORY: ?Family History  ?Problem Relation Age of Onset  ? Hodgkin's lymphoma Mother 22  27Thyroid cancer Mother   ?     (from radiation/hodgkin's treatment)  ? Breast cancer Mother 44  75    possibly related to radiation treatment for hodgkins  BRCA test is negative  ? Cancer Mother   ?     hodgkin's; thyroid; breast; now new tumor in thoracic spine from possible breast cancer.  ? Cervical cancer Maternal Aunt 53  ?     stage III - clinical trial - BRCA negative  ? Hyperlipidemia Maternal Grandfather   ? Hypertension Maternal Grandmother   ? Autism Cousin   ? Diabetes Neg Hx   ? Heart disease Neg Hx   ? ? ?  ?Objective:  ?*** ?General: No acute distress.  Patient appears ***-groomed.   ?Head:  Normocephalic/atraumatic ?Eyes:  Fundi examined but not visualized ?Neck: supple, no paraspinal tenderness, full range of motion ?Heart:  Regular rate and rhythm ?Lungs:  Clear to auscultation bilaterally ?Back: No paraspinal tenderness ?Neurological Exam: alert and oriented to person, place, and time.  Speech fluent and not  dysarthric, language intact.  CN II-XII intact. Bulk and tone normal, muscle strength 5/5 throughout.  Sensation to light touch intact.  Deep tendon reflexes 2+ throughout, toes downgoing.  Finger to nose testing intact.  Gait normal, Romberg negative. ? ? ?Metta Clines, DO ? ?CC: *** ? ? ? ? ? ? ?

## 2022-04-16 ENCOUNTER — Ambulatory Visit: Payer: BC Managed Care – PPO | Admitting: Neurology

## 2022-05-16 DIAGNOSIS — Z131 Encounter for screening for diabetes mellitus: Secondary | ICD-10-CM | POA: Diagnosis not present

## 2022-05-16 DIAGNOSIS — Z13228 Encounter for screening for other metabolic disorders: Secondary | ICD-10-CM | POA: Diagnosis not present

## 2022-05-16 DIAGNOSIS — Z1329 Encounter for screening for other suspected endocrine disorder: Secondary | ICD-10-CM | POA: Diagnosis not present

## 2022-05-16 DIAGNOSIS — E669 Obesity, unspecified: Secondary | ICD-10-CM | POA: Diagnosis not present

## 2022-05-30 ENCOUNTER — Telehealth: Payer: BC Managed Care – PPO | Admitting: Physician Assistant

## 2022-05-30 ENCOUNTER — Encounter: Payer: Self-pay | Admitting: Physician Assistant

## 2022-05-30 VITALS — Temp 100.7°F | Wt 191.0 lb

## 2022-05-30 DIAGNOSIS — J0140 Acute pansinusitis, unspecified: Secondary | ICD-10-CM

## 2022-05-30 DIAGNOSIS — R051 Acute cough: Secondary | ICD-10-CM | POA: Diagnosis not present

## 2022-05-30 MED ORDER — CEFDINIR 300 MG PO CAPS: 300.0000 mg | ORAL_CAPSULE | Freq: Two times a day (BID) | ORAL | 0 refills | Status: DC

## 2022-05-30 NOTE — Progress Notes (Signed)
Start time: 09:20 am End time: 09:36 am  Virtual Visit via Video Note   Patient ID: Marilyn Hunter, female    DOB: 1990/04/07, 32 y.o.   MRN: [id number]  I connected with above patient on 05/30/22 by a video enabled telemedicine application and verified that I am speaking with the correct person using two identifiers.  Location: Patient: home Provider: office   I discussed the limitations of evaluation and management by telemedicine and the availability of in person appointments. The patient expressed understanding and agreed to proceed.  History of Present Illness:  Chief Complaint  Patient presents with   Acute Visit    Virtual- Sore throat and congestion, swollen lymph nodes and face pain. Coughing up green mucous. Covid test done at home last night and was negative.   Reports a 3 day history of sore throat, fatigue, coughing a lot and blowing green out of her nose, bilateral cheek pain to ears with headache, denies sick contacts, last night home covid test negative, denies recent travel, otc cold medicine is a little helpful, no asthma, no tobacco use, + head congestion, hx/o of sinusitis, bu no sinus surgery, no allergic rhinitis;  +  fever 100.7 tylenol helped/denies chills, denies nausea/vomiting, denies diarrhea/constipation, has IUD   Observations/Objective:  Temp (!) 100.7 F (38.2 C)   Wt 191 lb (86.6 kg)   BMI 29.47 kg/m    Assessment: Encounter Diagnoses  Name Primary?   Acute non-recurrent pansinusitis Yes   Acute cough      Plan: [name] was seen today for acute visit.  Diagnoses and all orders for this visit:  Acute non-recurrent pansinusitis - cefdinir bid - For nasal congestion and post nasal drip you can use an OTC nasal saline rinse as well as one of the OTC nasal steroids (generic equivalent) like Flonase (Fluticasone) or Rhinocort (Budesonide) or Nasacort (Triamcinolone) or Nasonex (Mometasone Furoate).   Acute cough - You can take OTC cough  suppressant as needed like dextromethorphan (DM) in any OTC cough medicine that has the abbreviation DM.   Other orders -     cefdinir (OMNICEF) 300 MG capsule; Take 1 capsule (300 mg total) by mouth 2 (two) times daily.    Follow up: call for a follow up appoitment   I discussed the assessment and treatment plan with the patient. The patient was provided an opportunity to ask questions and all were answered. The patient agreed with the plan and demonstrated an understanding of the instructions.   The patient was advised to call back or seek an in-person evaluation if the symptoms worsen or if the condition fails to improve as anticipated. For emergencies go to Urgent Care or the Emergency Department for immediate evaluation.   I spent 15 minutes dedicated to the care of this patient, including pre-visit review of records, face to face time, post-visit ordering of testing and documentation.    Jake Shark, PA-C

## 2022-05-30 NOTE — Patient Instructions (Signed)
You can take OTC cough suppressant as needed like dextromethorphan (DM) in any OTC cough medicine that has the abbreviation DM.  For nasal congestion and post nasal drip you can use an OTC nasal saline rinse as well as one of the OTC nasal steroids (generic equivalent) like Flonase (Fluticasone) or Rhinocort (Budesonide) or Nasacort (Triamcinolone) or Nasonex (Mometasone Furoate).

## 2022-09-05 ENCOUNTER — Encounter: Payer: Self-pay | Admitting: Internal Medicine

## 2022-09-10 ENCOUNTER — Ambulatory Visit: Payer: BC Managed Care – PPO | Admitting: Family Medicine

## 2022-09-10 VITALS — BP 114/74 | HR 90 | Temp 96.8°F | Wt 214.0 lb

## 2022-09-10 DIAGNOSIS — R112 Nausea with vomiting, unspecified: Secondary | ICD-10-CM

## 2022-09-10 DIAGNOSIS — G43719 Chronic migraine without aura, intractable, without status migrainosus: Secondary | ICD-10-CM | POA: Diagnosis not present

## 2022-09-10 MED ORDER — KETOROLAC TROMETHAMINE 60 MG/2ML IM SOLN
60.0000 mg | Freq: Once | INTRAMUSCULAR | Status: AC
  Administered 2022-09-10: 60 mg via INTRAMUSCULAR

## 2022-09-10 MED ORDER — PROMETHAZINE HCL 25 MG/ML IJ SOLN
25.0000 mg | Freq: Four times a day (QID) | INTRAMUSCULAR | Status: DC | PRN
  Administered 2022-09-10: 25 mg via INTRAMUSCULAR

## 2022-09-10 NOTE — Progress Notes (Signed)
   Subjective:    Patient ID: Marilyn Hunter, female    DOB: 1990-01-09, 32 y.o.   MRN: [id number]  HPI She is here for consult concerning migraine.  She presently takes Emgality regularly and when she has a breakthrough uses Nurtec.  In spite of this this particular headache has continued.  She is having difficulty with vomiting with a headache.  She indicates that when he gets to this point usually a combination of Phenergan and Toradol works.   Review of Systems     Objective:   Physical Exam Alert and in moderate distress  complaining of headache.       Assessment & Plan:  Intractable chronic migraine without aura and without status migrainosus  Nausea and vomiting, unspecified vomiting type I will give her 25 mg of Phenergan and 60 of Toradol.

## 2022-10-05 ENCOUNTER — Encounter: Payer: Self-pay | Admitting: Pharmacy Technician

## 2022-10-09 ENCOUNTER — Encounter: Payer: Self-pay | Admitting: Internal Medicine

## 2022-10-22 ENCOUNTER — Encounter: Payer: Self-pay | Admitting: Internal Medicine

## 2022-11-08 ENCOUNTER — Encounter: Payer: Self-pay | Admitting: Family Medicine

## 2022-11-08 ENCOUNTER — Ambulatory Visit: Payer: BC Managed Care – PPO | Admitting: Family Medicine

## 2022-11-08 VITALS — BP 130/80 | HR 96 | Ht 67.0 in | Wt 219.2 lb

## 2022-11-08 DIAGNOSIS — S0501XA Injury of conjunctiva and corneal abrasion without foreign body, right eye, initial encounter: Secondary | ICD-10-CM | POA: Diagnosis not present

## 2022-11-08 MED ORDER — ERYTHROMYCIN 5 MG/GM OP OINT: 1.0000 | TOPICAL_OINTMENT | Freq: Every day | OPHTHALMIC | 0 refills | Status: DC

## 2022-11-08 MED ORDER — CIPROFLOXACIN HCL 0.3 % OP SOLN: 1.0000 [drp] | OPHTHALMIC | 0 refills | Status: DC

## 2022-11-08 NOTE — Progress Notes (Signed)
Chief Complaint  Patient presents with   Abrasion    Thinks she has a scratch on her right eye. She has chronic hormonal migraines and had one and put pressure on that eye, thinks she may have scratched her eye.    She reports having an ice pick type sensation to R eye noted on Tuesday (in the setting of worsening migraine related to her menstrual cycle).  While rubbing her R eye, when putting pressure on eye, she thinks she scratched the outer corner of her R eye. The next day (yesterday morning), her right eye was very red, and bothering her.  She tried flushing it with water. She feels like there is sand in her eye.  Tried an eye drop (for redness, itchiness), caused it to sting, didn't help.  No light sensitivity, no watering of the eyes.  Vision is normal.   PMH, PSH, SH reviewed   Outpatient Encounter Medications as of 11/08/2022  Medication Sig Note   Galcanezumab-gnlm (EMGALITY) 120 MG/ML SOAJ Inject 120 mg into the skin every 28 (twenty-eight) days.    acetaminophen (TYLENOL) 500 MG tablet Take 500 mg by mouth every 6 (six) hours as needed. (Patient not taking: Reported on 11/08/2022) 11/08/2022: prn   naproxen sodium (ANAPROX) 550 MG tablet Take 1 tablet twice daily beginning 7 days prior to period and continue daily for 14 days. (Patient not taking: Reported on 11/08/2022) 11/08/2022: prn   ondansetron (ZOFRAN ODT) 8 MG disintegrating tablet Take 1 tablet (8 mg total) by mouth every 8 (eight) hours as needed for nausea or vomiting. (Patient not taking: Reported on 09/10/2022) 11/08/2022: prn   promethazine (PHENERGAN) 25 MG suppository Place 1 suppository (25 mg total) rectally every 6 (six) hours as needed for nausea or vomiting. (Patient not taking: Reported on 09/10/2022) 11/08/2022: prn   promethazine (PHENERGAN) 25 MG tablet Take 25 mg by mouth every 6 (six) hours as needed for nausea or vomiting. (Patient not taking: Reported on 11/08/2022) 11/08/2022: prn   Rimegepant Sulfate (NURTEC)  75 MG TBDP Take 1 tablet by mouth daily as needed (Maximum 1 tablet in 24 hours). (Patient not taking: Reported on 11/08/2022) 11/08/2022: prn   [DISCONTINUED] cefdinir (OMNICEF) 300 MG capsule Take 1 capsule (300 mg total) by mouth 2 (two) times daily. (Patient not taking: Reported on 09/10/2022)    Facility-Administered Encounter Medications as of 11/08/2022  Medication   promethazine (PHENERGAN) injection 25 mg   No Known Allergies  ROS: no fever, chills, URI symptoms. Menses are very light. Recent cycle was heavier. Migraines, worse around her cycles. Eye discomfort per HPI; no light sensitivity, watering or change in visual acuity.    PHYSICAL EXAM:  BP 130/80   Pulse 96   Ht 5\' 7"  (1.702 m)   Wt 219 lb 3.2 oz (99.4 kg)   LMP  (Exact Date)   BMI 34.33 kg/m   Well-appearing, pleasant female, in no distress HEENT: conjunctiva and sclera are clear on the left. Right eye--upper lateral portion there is a focal area of erythema, inflamed. There is fluorescein uptake in this area. No foreign body present. EOMI   ASSESSMENT/PLAN:  Abrasion of right cornea, initial encounter - Plan: erythromycin ophthalmic ointment, ciprofloxacin (CILOXAN) 0.3 % ophthalmic solution  To use erythro qHS, and cipro q4 hours during the day. To f/u with ophtho if sx persist/worsen. Will contact if having difficulty getting an ophtho appt.

## 2023-01-15 ENCOUNTER — Telehealth: Payer: BC Managed Care – PPO | Admitting: Medical

## 2023-01-15 ENCOUNTER — Encounter: Payer: Self-pay | Admitting: Medical

## 2023-01-15 VITALS — Ht 67.5 in | Wt 219.0 lb

## 2023-01-15 DIAGNOSIS — J019 Acute sinusitis, unspecified: Secondary | ICD-10-CM

## 2023-01-15 MED ORDER — AMOXICILLIN 875 MG PO TABS: 875.0000 mg | ORAL_TABLET | Freq: Two times a day (BID) | ORAL | 0 refills | Status: AC

## 2023-01-15 NOTE — Progress Notes (Signed)
Subjective:     Patient ID: Marilyn Hunter, female   DOB: 07/12/1990, 33 y.o.   MRN: [id number]  This visit type was conducted due to national recommendations for restrictions regarding the COVID-19 Pandemic (e.g. social distancing) in an effort to limit this patient's exposure and mitigate transmission in our community.  Due to their co-morbid illnesses, this patient is at least at moderate risk for complications without adequate follow up.  This format is felt to be most appropriate for this patient at this time.    Documentation for virtual audio and video telecommunications through [place] encounter:  The patient was located at home. The provider was located in the office. The patient did consent to this visit and is aware of possible charges through their insurance for this visit.  The other persons participating in this telemedicine service were none. Time spent on call was 20 minutes and in review of previous records 20 minutes total.  This virtual service is not related to other E/M service within previous 7 days.   HPI Chief Complaint  Patient presents with   other    Started Sunday feeling off, woke up yesterday coughing up green mucous, sinus pain and pressure on face around the jaw, teeth hurt, no fever,    Virtual consult for possible sinus infection.  She has a history of getting sinus infections once or twice a year.  She notes sinus pressure, head pressure, getting up green mucus from the head congestion, some fatigue.  No sore throat, no fever, bodyaches or chills, no nausea vomiting or diarrhea.  She does have a 68-year-old in preschool who has some sniffles and it is not uncommon for germs to be brought home from preschool.  Using some DayQuil, Aleve.  She is heading out to [place] this weekend for vacation and does not want to feel sick then.  Past Medical History:  Diagnosis Date   Abnormal Pap smear 2006   ASCUS, pos HR HPV   Allergic rhinitis, cause  unspecified    Common migraine with intractable migraine 04/03/2017   Migraines    chronic   Ovarian cyst    Vaginal Pap smear, abnormal    Current Outpatient Medications on File Prior to Visit  Medication Sig Dispense Refill   Galcanezumab-gnlm (EMGALITY) 120 MG/ML SOAJ Inject 120 mg into the skin every 28 (twenty-eight) days. 1.12 mL 5   acetaminophen (TYLENOL) 500 MG tablet Take 500 mg by mouth every 6 (six) hours as needed. (Patient not taking: Reported on 11/08/2022)     ciprofloxacin (CILOXAN) 0.3 % ophthalmic solution Place 1-2 drops into the right eye every 4 (four) hours while awake. 5 mL 0   erythromycin ophthalmic ointment Place 1 Application into the right eye at bedtime. 3.5 g 0   naproxen sodium (ANAPROX) 550 MG tablet Take 1 tablet twice daily beginning 7 days prior to period and continue daily for 14 days. (Patient not taking: Reported on 11/08/2022) 28 tablet 5   ondansetron (ZOFRAN ODT) 8 MG disintegrating tablet Take 1 tablet (8 mg total) by mouth every 8 (eight) hours as needed for nausea or vomiting. (Patient not taking: Reported on 09/10/2022) 20 tablet 5   promethazine (PHENERGAN) 25 MG suppository Place 1 suppository (25 mg total) rectally every 6 (six) hours as needed for nausea or vomiting. (Patient not taking: Reported on 09/10/2022) 12 each 0   promethazine (PHENERGAN) 25 MG tablet Take 25 mg by mouth every 6 (six) hours as needed for nausea or vomiting. (Patient  not taking: Reported on 11/08/2022)     Rimegepant Sulfate (NURTEC) 75 MG TBDP Take 1 tablet by mouth daily as needed (Maximum 1 tablet in 24 hours). (Patient not taking: Reported on 11/08/2022) 8 tablet 11   Current Facility-Administered Medications on File Prior to Visit  Medication Dose Route Frequency Provider Last Rate Last Admin   promethazine (PHENERGAN) injection 25 mg  25 mg Intramuscular Q6H PRN Denita Lung, MD   25 mg at 09/10/22 1316    Review of Systems As in subjective    Objective:    Physical Exam Due to coronavirus pandemic stay at home measures, patient visit was virtual and they were not examined in person.   Ht 5' 7.5" (1.715 m)   Wt 219 lb (99.3 kg)   BMI 33.79 kg/m   General: Well-developed well-nourished no acute distress Not ill-appearing     Assessment:     Encounter Diagnosis  Name Primary?   Acute sinusitis, recurrence not specified, unspecified location Yes       Plan:     We discussed symptoms, concerns, and her history of sinusitis.  Advised rest, hydration, nasal saline, we discussed using Flonase for the upcoming plane trip if needed.  We discussed diagnosis and symptoms of sinusitis versus common cold.  She had a negative COVID test recently.  Medication as below.  If worse or not much improved over the course of the week then let us know  [name] was seen today for other.  Diagnoses and all orders for this visit:  Acute sinusitis, recurrence not specified, unspecified location  Other orders -     amoxicillin (AMOXIL) 875 MG tablet; Take 1 tablet (875 mg total) by mouth 2 (two) times daily for 10 days.  F/u prn

## 2023-02-25 ENCOUNTER — Encounter: Payer: Self-pay | Admitting: Family Medicine

## 2023-02-26 MED ORDER — NURTEC 75 MG PO TBDP: 1.0000 | ORAL_TABLET | Freq: Every day | ORAL | 11 refills | Status: AC | PRN

## 2023-02-26 NOTE — Addendum Note (Signed)
Addended by: Denita Lung on: 02/26/2023 01:24 PM   Modules accepted: Orders

## 2023-03-12 ENCOUNTER — Telehealth: Payer: Self-pay | Admitting: Internal Medicine

## 2023-03-12 NOTE — Telephone Encounter (Signed)
P.A for nurtec was done by someone else but was denied and has no outcome. Please advise as I do not know how to start a new one for pt

## 2023-03-16 NOTE — Telephone Encounter (Signed)
Appeal "Provider Courtesy Review" being submitted through fax for Nurtec, expect a 7 day response (3/21). [employer] not showing any current or prior appeals, they are unsure what happened. By Burman Riis

## 2023-03-26 DIAGNOSIS — R635 Abnormal weight gain: Secondary | ICD-10-CM | POA: Diagnosis not present

## 2023-04-18 ENCOUNTER — Telehealth: Payer: Self-pay | Admitting: Family Medicine

## 2023-04-18 NOTE — Telephone Encounter (Signed)
Started another PA for Nurtec thru cover my meds

## 2024-01-02 ENCOUNTER — Ambulatory Visit: Payer: BC Managed Care – PPO | Admitting: Family Medicine

## 2024-01-02 ENCOUNTER — Encounter: Payer: Self-pay | Admitting: Family Medicine

## 2024-01-02 VITALS — BP 130/80 | HR 80 | Temp 99.3°F | Ht 67.0 in | Wt 223.8 lb

## 2024-01-02 DIAGNOSIS — R6889 Other general symptoms and signs: Secondary | ICD-10-CM | POA: Diagnosis not present

## 2024-01-02 DIAGNOSIS — R509 Fever, unspecified: Secondary | ICD-10-CM

## 2024-01-02 DIAGNOSIS — R051 Acute cough: Secondary | ICD-10-CM

## 2024-01-02 LAB — POCT INFLUENZA A/B
Influenza A, POC: NEGATIVE
Influenza B, POC: NEGATIVE

## 2024-01-02 MED ORDER — AZITHROMYCIN 250 MG PO TABS: ORAL_TABLET | ORAL | 0 refills | Status: DC

## 2024-01-02 NOTE — Progress Notes (Signed)
 Chief Complaint  Patient presents with   Sore Throat    ST that started on Christmas. By the end of the day she started feeling fatigued. Next day felt horrible, fever, chills, body aches and cough. Worsened over the weekend. Coughing up brown mucus every am for over an hour. Chest feels very heavy and tight. Neg covid tests Fri am Monday.     12/25 started with sore throat. 12/26 she woke up feeling like she was hit by a ton of bricks--body aches, fatigued, congestion, some chills. Didn't check her temperature. She was just slightly better over the weekend, but was extremely fatigued after more activity (running errands, etc). She has chest congestion the last 2 days, feels some tightness/heaviness in her chest.   Nasal drainage is clear-yellow. Phlegm has been dark yellow-light brown the last couple of days. No problems walking around house, but had chest tightness when she tried to go for a walk, with some DOE.  No h/o asthma.  Using Dayquil/Nyquil throughout the illness, hasn't taken any today. Had been using since Christmas. Dayquil helps her sleep. Took mucinex DM 12 hour this morning.   +sick contact--son with high fevers the week before Christmas (all negative tests--COVID, RSV, flu, strep) Husband was then also sick--treated with a z-pak and is feeling better   PMH, PSH, SH reviewed  Outpatient Encounter Medications as of 01/02/2024  Medication Sig Note   dextromethorphan-guaiFENesin (MUCINEX DM) 30-600 MG 12hr tablet Take 1 tablet by mouth 2 (two) times daily.    Pseudoeph-Doxylamine-DM-APAP (NYQUIL PO) Take 2 each by mouth as needed. 01/02/2024: Last dose last night   Pseudoephedrine-APAP-DM (DAYQUIL PO) Take 2 capsules by mouth as needed. 01/02/2024: Last dose yesterday   acetaminophen  (TYLENOL ) 500 MG tablet Take 500 mg by mouth every 6 (six) hours as needed. (Patient not taking: Reported on 01/02/2024) 01/02/2024: As needed   Galcanezumab -gnlm (EMGALITY ) 120 MG/ML SOAJ Inject 120  mg into the skin every 28 (twenty-eight) days. (Patient not taking: Reported on 01/02/2024) 01/02/2024: As needed   naproxen  sodium (ANAPROX ) 550 MG tablet Take 1 tablet twice daily beginning 7 days prior to period and continue daily for 14 days. (Patient not taking: Reported on 01/02/2024) 01/02/2024: As needed   ondansetron  (ZOFRAN  ODT) 8 MG disintegrating tablet Take 1 tablet (8 mg total) by mouth every 8 (eight) hours as needed for nausea or vomiting. (Patient not taking: Reported on 01/02/2024) 11/08/2022: prn   promethazine  (PHENERGAN ) 25 MG suppository Place 1 suppository (25 mg total) rectally every 6 (six) hours as needed for nausea or vomiting. (Patient not taking: Reported on 01/02/2024) 01/02/2024: As needed   promethazine  (PHENERGAN ) 25 MG tablet Take 25 mg by mouth every 6 (six) hours as needed for nausea or vomiting. (Patient not taking: Reported on 01/02/2024) 01/02/2024: As needed   Rimegepant Sulfate (NURTEC) 75 MG TBDP Take 1 tablet (75 mg total) by mouth daily as needed (Maximum 1 tablet in 24 hours). (Patient not taking: Reported on 01/02/2024) 01/02/2024: As needed   [DISCONTINUED] ciprofloxacin  (CILOXAN ) 0.3 % ophthalmic solution Place 1-2 drops into the right eye every 4 (four) hours while awake.    [DISCONTINUED] erythromycin  ophthalmic ointment Place 1 Application into the right eye at bedtime.    Facility-Administered Encounter Medications as of 01/02/2024  Medication   promethazine  (PHENERGAN ) injection 25 mg   No Known Allergies  ROS: URI symptoms per HPI.  No n/v/d, rash, chest pain. +fatigue, DOE. See HPI    PHYSICAL EXAM:  BP 130/80  Pulse 80   Temp 99.3 F (37.4 C) (Tympanic)   Ht 5' 7 (1.702 m)   Wt 223 lb 12.8 oz (101.5 kg)   SpO2 98%   Breastfeeding No   BMI 35.05 kg/m   Well-appearing, pleasant female, some nasal stuffiness and sniffling. Speaking comfortably. No coughing during visit. HEENT: PERRL, EOMI, conjunctiva and sclera are clear. TM's and EAC's normal Nasal  mucosa is mild-mod edematous L>R, with clear mucus and no erythema or purulence Sinuses nontender OP is clear Neck: some mildly tender shotty anterior cervical lymphadenopathy Heart: regular rate and rhythm, no murmur Lungs: clear bilaterally, no wheezes, rales, ronchi Psych: normal mood, affect, hygiene and grooming Neuro: alert and oriented, cranial nerves grossly intact, normal gait.  Influenza A&B negative  ASSESSMENT/PLAN:  Flu-like symptoms - negative tests for influenza. Continue supportive measures.  Start ABX if worsening rather than improving (persistent purulence, fever)  Fever in adult - Plan: Influenza A/B  Acute cough - Plan: Influenza A/B, azithromycin  (ZITHROMAX ) 250 MG tablet  Drink plenty of water. Continue the 12 hour Mucinex DM twice daily. You can use pseudoephedrine along with it, if needed for sinus congestion. You can also try sinus rinses (neti-pot, or sinus rinse kit)., up to twice daily.  Stop the Dayquil and Nyquil (they will overlap with these meds).  Start the z-pak if you are getting worse rather than improving (discolored mucus or phlegm, persistent fevers.)

## 2024-01-02 NOTE — Patient Instructions (Signed)
  Drink plenty of water. Continue the 12 hour Mucinex DM twice daily. You can use pseudoephedrine along with it, if needed for sinus congestion. You can also try sinus rinses (neti-pot, or sinus rinse kit)., up to twice daily.  Stop the Dayquil and Nyquil (they will overlap with these meds).  Start the z-pak if you are getting worse rather than improving (discolored mucus or phlegm, persistent fevers.)

## 2024-02-18 ENCOUNTER — Telehealth: Payer: BC Managed Care – PPO | Admitting: Family Medicine

## 2024-02-18 ENCOUNTER — Encounter: Payer: Self-pay | Admitting: Family Medicine

## 2024-02-18 VITALS — Temp 99.0°F | Ht 67.0 in | Wt 210.0 lb

## 2024-02-18 DIAGNOSIS — J111 Influenza due to unidentified influenza virus with other respiratory manifestations: Secondary | ICD-10-CM

## 2024-02-18 MED ORDER — OSELTAMIVIR PHOSPHATE 75 MG PO CAPS: 75.0000 mg | ORAL_CAPSULE | Freq: Two times a day (BID) | ORAL | 0 refills | Status: DC

## 2024-02-18 NOTE — Progress Notes (Signed)
   Subjective:    Patient ID: Marilyn Hunter, female    DOB: 25-Jul-1990, 34 y.o.   MRN: [id number]  HPI Documentation for virtual audio and video telecommunications through [place] encounter:  The patient was located at home. 2 patient identifiers used.  The provider was located in the office. The patient did consent to this visit and is aware of possible charges through their insurance for this visit. The other persons participating in this telemedicine service were none. Time spent on call was 5 minutes and in review of previous records >15 minutes total for counseling and coordination of care. This virtual service is not related to other E/M service within previous 7 days.  Her son was recently diagnosed with the flu and today she woke up with a sore throat, cough, myalgias and fatigue.  Review of Systems     Objective:    Physical Exam Alert and in no distress otherwise not examined       Assessment & Plan:  Influenza - Plan: oseltamivir (TAMIFLU) 75 MG capsule Also recommend Tylenol for fever aches and pains and can return to full activity when she is fever free for at least 24 hours without an antipyretic.

## 2024-02-25 ENCOUNTER — Encounter: Payer: Self-pay | Admitting: Internal Medicine

## 2024-05-07 DIAGNOSIS — Z124 Encounter for screening for malignant neoplasm of cervix: Secondary | ICD-10-CM | POA: Diagnosis not present

## 2024-05-07 DIAGNOSIS — Z1330 Encounter for screening examination for mental health and behavioral disorders, unspecified: Secondary | ICD-10-CM | POA: Diagnosis not present

## 2024-05-07 DIAGNOSIS — Z01419 Encounter for gynecological examination (general) (routine) without abnormal findings: Secondary | ICD-10-CM | POA: Diagnosis not present

## 2024-05-12 LAB — HM PAP SMEAR: HPV, high-risk: NEGATIVE

## 2024-05-14 DIAGNOSIS — N926 Irregular menstruation, unspecified: Secondary | ICD-10-CM | POA: Diagnosis not present

## 2024-05-14 DIAGNOSIS — L680 Hirsutism: Secondary | ICD-10-CM | POA: Diagnosis not present

## 2024-05-14 DIAGNOSIS — R635 Abnormal weight gain: Secondary | ICD-10-CM | POA: Diagnosis not present

## 2024-05-14 DIAGNOSIS — R5383 Other fatigue: Secondary | ICD-10-CM | POA: Diagnosis not present

## 2024-05-15 LAB — LAB REPORT - SCANNED: EGFR: 117

## 2024-05-18 DIAGNOSIS — E559 Vitamin D deficiency, unspecified: Secondary | ICD-10-CM | POA: Diagnosis not present

## 2024-05-18 DIAGNOSIS — R140 Abdominal distension (gaseous): Secondary | ICD-10-CM | POA: Diagnosis not present

## 2024-05-18 DIAGNOSIS — Z8041 Family history of malignant neoplasm of ovary: Secondary | ICD-10-CM | POA: Diagnosis not present

## 2024-07-14 DIAGNOSIS — R1013 Epigastric pain: Secondary | ICD-10-CM | POA: Diagnosis not present

## 2024-07-15 ENCOUNTER — Ambulatory Visit: Admitting: Family Medicine

## 2024-07-15 ENCOUNTER — Encounter: Payer: Self-pay | Admitting: Family Medicine

## 2024-07-15 VITALS — BP 120/80 | HR 84 | Temp 98.3°F | Wt 235.6 lb

## 2024-07-15 DIAGNOSIS — R1011 Right upper quadrant pain: Secondary | ICD-10-CM

## 2024-07-15 MED ORDER — HYDROCODONE-ACETAMINOPHEN 5-325 MG PO TABS: 1.0000 | ORAL_TABLET | Freq: Four times a day (QID) | ORAL | 0 refills | Status: DC | PRN

## 2024-07-15 NOTE — Progress Notes (Signed)
   Subjective:    Patient ID: Marilyn Hunter, female    DOB: [birth year], 34 y.o.   MRN: [id number]  HPI She states that Saturday she developed some upper abdominal pain that became more localized in the right upper quadrant.  She states that any food does tend to make this worse and it usually occurs several minutes after she eats.  She was seen yesterday in an urgent care center and is scheduled for an ultrasound however she decided to come here for further care.   Review of Systems     Objective:    Physical Exam Alert and in no distress.  Abdominal exam shows decreased bowel sounds with right upper quadrant tenderness to palpation, Murphy sign and Murphy's punch.       Assessment & Plan:  Right upper quadrant pain - Plan: US  Abdomen Limited RUQ (LIVER/GB), HYDROcodone -acetaminophen  (NORCO/VICODIN) 5-325 MG tablet Discussed the treatment of this including getting an ultrasound relatively quickly to assess this in more detail.  Will give her pain medication but also explained that if the pain, fever chills gets worse will need to be just KerraPro more expeditiously.  Mention that that would not need to order a HIDA scan if no stone is seen.

## 2024-07-15 NOTE — Patient Instructions (Signed)
Avoid greasy foods

## 2024-07-16 ENCOUNTER — Inpatient Hospital Stay
Admission: RE | Admit: 2024-07-16 | Discharge: 2024-07-16 | Discharge status: Home / Self Care | Source: Ambulatory Visit | Attending: Family Medicine | Admitting: Family Medicine

## 2024-07-16 DIAGNOSIS — R1011 Right upper quadrant pain: Secondary | ICD-10-CM

## 2024-07-16 DIAGNOSIS — R140 Abdominal distension (gaseous): Secondary | ICD-10-CM | POA: Diagnosis not present

## 2024-07-17 ENCOUNTER — Ambulatory Visit: Payer: Self-pay | Admitting: Family Medicine

## 2024-07-17 DIAGNOSIS — R1011 Right upper quadrant pain: Secondary | ICD-10-CM

## 2024-07-17 NOTE — Addendum Note (Signed)
 Addended by: JOYCE NORLEEN BROCKS on: 07/17/2024 08:44 AM   Modules accepted: Orders

## 2024-07-21 NOTE — Telephone Encounter (Signed)
 Sent to Referral team yesterday 7/21 to schedule   Copied from CRM [id number]. Topic: Referral - Status >> Jul 21, 2024 12:15 PM Carlatta H wrote: Reason for CRM: Please call patient regarding referral status

## 2024-07-22 NOTE — Telephone Encounter (Unsigned)
 Copied from CRM [id number]. Topic: Referral - Status >> Jul 22, 2024 11:43 AM Graeme ORN wrote: Reason for CRM: Bascom called from [facility]. States she scheduled appt for patient tomorrow due to pain. Referral is in but it still pending. She needs to know if authorized or if authorization required. She will send a secure chat to provider as well. Phone: [phone]

## 2024-07-23 ENCOUNTER — Encounter (HOSPITAL_COMMUNITY)
Admission: RE | Admit: 2024-07-23 | Discharge: 2024-07-23 | Disposition: A | Discharge status: Home / Self Care | Source: Ambulatory Visit | Attending: Family Medicine | Admitting: Family Medicine

## 2024-07-23 ENCOUNTER — Encounter (HOSPITAL_COMMUNITY): Payer: Self-pay

## 2024-07-23 DIAGNOSIS — R1011 Right upper quadrant pain: Secondary | ICD-10-CM | POA: Insufficient documentation

## 2024-07-23 DIAGNOSIS — R112 Nausea with vomiting, unspecified: Secondary | ICD-10-CM | POA: Diagnosis not present

## 2024-07-23 MED ORDER — TECHNETIUM TC 99M MEBROFENIN IV KIT
5.0000 | PACK | Freq: Once | INTRAVENOUS | Status: AC | PRN
  Administered 2024-07-23: 5.03 via INTRAVENOUS

## 2024-07-28 NOTE — Addendum Note (Signed)
 Addended by: JOYCE NORLEEN BROCKS on: 07/28/2024 12:15 PM   Modules accepted: Orders

## 2024-07-30 ENCOUNTER — Encounter: Payer: Self-pay | Admitting: Physician Assistant

## 2024-08-06 DIAGNOSIS — R110 Nausea: Secondary | ICD-10-CM | POA: Diagnosis not present

## 2024-08-06 DIAGNOSIS — R195 Other fecal abnormalities: Secondary | ICD-10-CM | POA: Diagnosis not present

## 2024-08-06 DIAGNOSIS — R5383 Other fatigue: Secondary | ICD-10-CM | POA: Diagnosis not present

## 2024-08-06 DIAGNOSIS — R140 Abdominal distension (gaseous): Secondary | ICD-10-CM | POA: Diagnosis not present

## 2024-08-06 DIAGNOSIS — R109 Unspecified abdominal pain: Secondary | ICD-10-CM | POA: Diagnosis not present

## 2024-08-07 DIAGNOSIS — R109 Unspecified abdominal pain: Secondary | ICD-10-CM | POA: Diagnosis not present

## 2024-08-07 DIAGNOSIS — R195 Other fecal abnormalities: Secondary | ICD-10-CM | POA: Diagnosis not present

## 2024-09-24 ENCOUNTER — Ambulatory Visit: Admitting: Physician Assistant

## 2024-09-29 DIAGNOSIS — K760 Fatty (change of) liver, not elsewhere classified: Secondary | ICD-10-CM | POA: Diagnosis not present

## 2024-09-29 DIAGNOSIS — R109 Unspecified abdominal pain: Secondary | ICD-10-CM | POA: Diagnosis not present

## 2024-09-29 DIAGNOSIS — R194 Change in bowel habit: Secondary | ICD-10-CM | POA: Diagnosis not present

## 2024-09-29 DIAGNOSIS — R112 Nausea with vomiting, unspecified: Secondary | ICD-10-CM | POA: Diagnosis not present

## 2024-10-06 ENCOUNTER — Ambulatory Visit: Admitting: Family Medicine

## 2024-10-06 ENCOUNTER — Ambulatory Visit: Payer: Self-pay

## 2024-10-06 ENCOUNTER — Encounter: Payer: Self-pay | Admitting: Family Medicine

## 2024-10-06 VITALS — BP 124/72 | Temp 98.2°F | Ht 67.0 in | Wt 227.4 lb

## 2024-10-06 DIAGNOSIS — J209 Acute bronchitis, unspecified: Secondary | ICD-10-CM

## 2024-10-06 MED ORDER — AMOXICILLIN-POT CLAVULANATE 875-125 MG PO TABS: 1.0000 | ORAL_TABLET | Freq: Two times a day (BID) | ORAL | 0 refills | Status: DC

## 2024-10-06 NOTE — Patient Instructions (Signed)
 Listen to your body Tylenol  for fever aches and pains and if you need something more either Advil  or Aleve  cough medicines as needed you should turn the corner and probably 24 to 48 hours

## 2024-10-06 NOTE — Telephone Encounter (Signed)
 FYI Only or Action Required?: Action required by provider: request for appointment.  Patient was last seen in primary care on 07/15/2024 by Joyce Norleen BROCKS, MD.  Called Nurse Triage reporting Sinusitis.  Symptoms began yesterday.  Interventions attempted: OTC medications: tylenol  and dayquil .  Symptoms are: gradually worsening.  Triage Disposition: See Physician Within 24 Hours  Patient/caregiver understands and will follow disposition?: YesCopied from CRM #[id number]. Topic: Clinical - Red Word Triage >> Oct 06, 2024  8:59 AM Gustabo D wrote: Started feeling down yesterday and today she got overheated took her temp it was 101 took a covid test it came back negative and she wonders if it's the flu. Feels like she's been hit by bricks. Reason for Disposition  [1] Sinus pain (not just congestion) AND [2] fever  Answer Assessment - Initial Assessment Questions Took covid home test this morning and was negative. Pt has taken tylenol  and dayQuil.      1. LOCATION: Where does it hurt?      Under eyes/cheek area 2. ONSET: When did the sinus pain start?  (e.g., hours, days)      Monday  3. SEVERITY: How bad is the pain?   (Scale 0-10; or none, mild, moderate or severe)     na 4. RECURRENT SYMPTOM: Have you ever had sinus problems before? If Yes, ask: When was the last time? and What happened that time?      yes 5. NASAL CONGESTION: Is the nose blocked? If Yes, ask: Can you open it or must you breathe through your mouth?     yes 6. NASAL DISCHARGE: Do you have discharge from your nose? If so ask, What color?     Green/brown 7. FEVER: Do you have a fever? If Yes, ask: What is it, how was it measured, and when did it start?      101 8. OTHER SYMPTOMS: Do you have any other symptoms? (e.g., sore throat, cough, earache, difficulty breathing) Sore throat, cough, body aches  Protocols used: Sinus Pain or Congestion-A-AH

## 2024-10-06 NOTE — Progress Notes (Signed)
   Subjective:    Patient ID: Marilyn Hunter, female    DOB: [birth year], 34 y.o.   MRN: [id number]  Discussed the use of AI scribe software for clinical note transcription with the patient, who gave verbal consent to proceed.  History of Present Illness   Marilyn Hunter is a 34 year old female who presents with acute onset of productive cough and fever.  She began experiencing a sore throat and nasal congestion yesterday. Initially, her energy levels were normal, but she felt increasingly fatigued by the end of the day, which she attributed to a hectic work schedule involving travel and emergency situations.  This morning, she developed a productive cough with green and brown sputum, along with facial swelling and congestion. She experienced intense sweating and overheating while preparing for work, prompting her to check her temperature, which was 101.63F. Despite taking Tylenol , which reduced her fever, she sought medical attention due to the rapid onset of symptoms.  No prior history of allergies. Typically, her sinus infections follow a prolonged period of cold-like symptoms, which was not the case this time. She also mentions sleeping for ten hours last night, which is unusual for her as she normally sleeps for five hours.  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           Review of Systems     Objective:    Physical Exam Alert and in no distress. Tympanic membranes and canals are normal. Pharyngeal area is normal. Neck is supple without adenopathy or thyromegaly. Cardiac exam shows a regular sinus rhythm without murmurs or gallops. Lungs are clear to auscultation.       Assessment & Plan:  Assessment and Plan    Acute bronchitis Acute symptoms with productive cough and fever suggest bacterial infection. Lungs clear. Differential includes pneumonia, but treatment remains  unchanged.  The acute onset of this in a relatively short period of time makes me concerned about pneumococcal disease and I will therefore go ahead and treat her. - Prescribed Augmentin.     Listen to your body Tylenol  for fever aches and pains and if you need something more either Advil  or Aleve  cough medicines as needed you should turn the corner and probably 24 to 48 hours

## 2024-10-13 DIAGNOSIS — K2289 Other specified disease of esophagus: Secondary | ICD-10-CM | POA: Diagnosis not present

## 2024-10-13 DIAGNOSIS — K2980 Duodenitis without bleeding: Secondary | ICD-10-CM | POA: Diagnosis not present

## 2024-10-13 DIAGNOSIS — K2970 Gastritis, unspecified, without bleeding: Secondary | ICD-10-CM | POA: Diagnosis not present

## 2024-10-13 DIAGNOSIS — K5289 Other specified noninfective gastroenteritis and colitis: Secondary | ICD-10-CM | POA: Diagnosis not present

## 2024-10-13 DIAGNOSIS — R1310 Dysphagia, unspecified: Secondary | ICD-10-CM | POA: Diagnosis not present

## 2024-10-13 DIAGNOSIS — R197 Diarrhea, unspecified: Secondary | ICD-10-CM | POA: Diagnosis not present

## 2024-10-13 DIAGNOSIS — R1013 Epigastric pain: Secondary | ICD-10-CM | POA: Diagnosis not present

## 2024-10-13 DIAGNOSIS — K3189 Other diseases of stomach and duodenum: Secondary | ICD-10-CM | POA: Diagnosis not present

## 2024-10-13 LAB — HM COLONOSCOPY

## 2024-11-07 DIAGNOSIS — R21 Rash and other nonspecific skin eruption: Secondary | ICD-10-CM | POA: Diagnosis not present

## 2024-11-11 ENCOUNTER — Ambulatory Visit: Admitting: Medical

## 2024-11-11 VITALS — BP 110/68 | HR 68 | Temp 97.9°F | Wt 232.8 lb

## 2024-11-11 DIAGNOSIS — J3489 Other specified disorders of nose and nasal sinuses: Secondary | ICD-10-CM

## 2024-11-11 DIAGNOSIS — R058 Other specified cough: Secondary | ICD-10-CM

## 2024-11-11 DIAGNOSIS — J029 Acute pharyngitis, unspecified: Secondary | ICD-10-CM

## 2024-11-11 MED ORDER — AZITHROMYCIN 250 MG PO TABS: ORAL_TABLET | ORAL | 0 refills | Status: AC

## 2024-11-11 MED ORDER — BENZONATATE 200 MG PO CAPS: 200.0000 mg | ORAL_CAPSULE | Freq: Three times a day (TID) | ORAL | 0 refills | Status: AC | PRN

## 2024-11-11 NOTE — Progress Notes (Signed)
 Subjective:  Marilyn Hunter is a 34 y.o. female who presents for Chief Complaint  Patient presents with   Follow-up    Follow-up- was seen last month for bronchitis but still had a dry cough. Now has mucous- green and bad cough, fatigue. Son has been sick recently as well     Marilyn Hunter is a 33 year old female who presents with a persistent productive cough and sinus pressure.  She has been experiencing a persistent productive cough for approximately one month. She reports being seen here a month ago, diagnosed with bronchitis and she completed a course of antibiotics which provided some relief, but the cough never fully resolved. Recently, the cough has worsened, becoming more productive with significant mucus production, particularly in the morning.  She describes additional symptoms of facial puffiness and sinus pressure, primarily in the throat area, without significant nasal congestion. No fever, body aches, chills, vomiting, ear pain, wheezing, or shortness of breath. She reports nausea and a sore throat. Her son was diagnosed with an ear and sinus infection earlier this week, but she has not had any new sick contacts.  Her current medications for symptom relief include over-the-counter DayQuil and NyQuil. She has previously used Tessalon  Perles for cough management and finds them effective without significant sedation. She has not used nasal saline flushes but has tried salt water gargles.  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 She is managing these issues alongside her respiratory symptoms.  No other aggravating or relieving factors.    No other c/o.  Past Medical History:  Diagnosis Date   Abnormal Pap smear 2006   ASCUS, pos HR HPV   Allergic rhinitis, cause unspecified    Common migraine with intractable migraine 04/03/2017   Migraines    chronic   Ovarian cyst    Vaginal Pap smear,  abnormal    Current Outpatient Medications on File Prior to Visit  Medication Sig Dispense Refill   naproxen  sodium (ALEVE ) 220 MG tablet Take 440 mg by mouth.     ondansetron  (ZOFRAN  ODT) 8 MG disintegrating tablet Take 1 tablet (8 mg total) by mouth every 8 (eight) hours as needed for nausea or vomiting. 20 tablet 5   promethazine  (PHENERGAN ) 25 MG suppository Place 1 suppository (25 mg total) rectally every 6 (six) hours as needed for nausea or vomiting. 12 each 0   promethazine  (PHENERGAN ) 25 MG tablet Take 25 mg by mouth every 6 (six) hours as needed for nausea or vomiting.     Rimegepant Sulfate (NURTEC) 75 MG TBDP Take 1 tablet (75 mg total) by mouth daily as needed (Maximum 1 tablet in 24 hours). 8 tablet 11   Current Facility-Administered Medications on File Prior to Visit  Medication Dose Route Frequency Provider Last Rate Last Admin   promethazine  (PHENERGAN ) injection 25 mg  25 mg Intramuscular Q6H PRN Lalonde, John C, MD   25 mg at 09/10/22 1316   The following portions of the patient's history were reviewed and updated as appropriate: allergies, current medications, past family history, past medical history, past social history, past surgical history and problem list.  ROS Otherwise as in subjective above    Objective: BP 110/68   Pulse 68   Temp 97.9 F (36.6 C)   Wt 232 lb 12.8 oz (105.6 kg)   SpO2 96%   BMI 36.46 kg/m   General appearance: alert, no distress, well developed, well nourished  HEENT: normocephalic, sclerae anicteric, conjunctiva pink and moist, TMs pearly, nares with erythema, mucoid discharge, pharynx normal Oral cavity: MMM, no lesions Neck: supple, no lymphadenopathy, no thyromegaly, no masses Lungs: CTA bilaterally, no wheezes, rhonchi, or rales    Assessment: Encounter Diagnoses  Name Primary?   Sinus pressure Yes   Cough productive of purulent sputum    Sore throat      Plan: Acute sinusitis with persistent productive  cough Persistent productive cough with sinus pressure, post nasal drainage, and nausea, suggestive of sinusitis. Previous Augmentin provided temporary relief. Current symptoms align with sinusitis. - Prescribed azithromycin  (Z-Pak) for 5 days. - Prescribed Tessalon  Perles 200 mg as needed for cough, cautioning drowsiness. - Recommended nasal saline flush for mucus reduction. - Advised increased water intake. - Instructed to monitor for drowsiness with Tessalon  Perles and adjust dosage if necessary. -if not improving or worse in the next 3-5 days then call back or recheck   Danielys was seen today for follow-up.  Diagnoses and all orders for this visit:  Sinus pressure  Cough productive of purulent sputum  Sore throat  Other orders -     azithromycin  (ZITHROMAX ) 250 MG tablet; 2 tablets day 1, then 1 tablet days 2-4 -     benzonatate  (TESSALON ) 200 MG capsule; Take 1 capsule (200 mg total) by mouth 3 (three) times daily as needed for cough.    Follow up: prn

## 2024-11-18 DIAGNOSIS — K760 Fatty (change of) liver, not elsewhere classified: Secondary | ICD-10-CM | POA: Diagnosis not present

## 2024-11-18 DIAGNOSIS — R109 Unspecified abdominal pain: Secondary | ICD-10-CM | POA: Diagnosis not present

## 2024-11-18 DIAGNOSIS — R195 Other fecal abnormalities: Secondary | ICD-10-CM | POA: Diagnosis not present

## 2024-11-18 DIAGNOSIS — R112 Nausea with vomiting, unspecified: Secondary | ICD-10-CM | POA: Diagnosis not present

## 2024-12-30 ENCOUNTER — Other Ambulatory Visit: Payer: Self-pay

## 2024-12-30 ENCOUNTER — Other Ambulatory Visit (HOSPITAL_COMMUNITY): Payer: Self-pay

## 2024-12-30 DIAGNOSIS — K760 Fatty (change of) liver, not elsewhere classified: Secondary | ICD-10-CM | POA: Diagnosis not present

## 2024-12-30 DIAGNOSIS — R634 Abnormal weight loss: Secondary | ICD-10-CM | POA: Diagnosis not present

## 2024-12-30 DIAGNOSIS — R110 Nausea: Secondary | ICD-10-CM

## 2024-12-30 DIAGNOSIS — K580 Irritable bowel syndrome with diarrhea: Secondary | ICD-10-CM | POA: Diagnosis not present

## 2024-12-30 DIAGNOSIS — R112 Nausea with vomiting, unspecified: Secondary | ICD-10-CM | POA: Diagnosis not present

## 2025-01-04 ENCOUNTER — Inpatient Hospital Stay: Admission: RE | Admit: 2025-01-04 | Discharge: 2025-01-04 | Disposition: A | Source: Ambulatory Visit

## 2025-01-04 DIAGNOSIS — R634 Abnormal weight loss: Secondary | ICD-10-CM

## 2025-01-04 MED ORDER — IOPAMIDOL (ISOVUE-300) INJECTION 61%
100.0000 mL | Freq: Once | INTRAVENOUS | Status: AC | PRN
  Administered 2025-01-04: 100 mL via INTRAVENOUS

## 2025-01-06 ENCOUNTER — Encounter (HOSPITAL_COMMUNITY)
Admission: RE | Admit: 2025-01-06 | Discharge: 2025-01-06 | Disposition: A | Discharge status: Home / Self Care | Source: Ambulatory Visit

## 2025-01-06 ENCOUNTER — Encounter (HOSPITAL_COMMUNITY)

## 2025-01-06 DIAGNOSIS — R634 Abnormal weight loss: Secondary | ICD-10-CM | POA: Insufficient documentation

## 2025-01-06 DIAGNOSIS — R110 Nausea: Secondary | ICD-10-CM | POA: Diagnosis present

## 2025-01-06 MED ORDER — TECHNETIUM TC 99M SULFUR COLLOID
2.0000 | Freq: Once | INTRAVENOUS | Status: AC
  Administered 2025-01-06: 1.9 via ORAL

## 2025-01-12 ENCOUNTER — Ambulatory Visit: Admitting: Family Medicine

## 2025-01-12 VITALS — BP 100/76 | HR 84 | Temp 97.6°F | Wt 223.6 lb

## 2025-01-12 DIAGNOSIS — G43719 Chronic migraine without aura, intractable, without status migrainosus: Secondary | ICD-10-CM

## 2025-01-12 DIAGNOSIS — G43831 Menstrual migraine, intractable, with status migrainosus: Secondary | ICD-10-CM | POA: Diagnosis not present

## 2025-01-12 MED ORDER — KETOROLAC TROMETHAMINE 60 MG/2ML IM SOLN
60.0000 mg | Freq: Once | INTRAMUSCULAR | Status: AC
  Administered 2025-01-12: 60 mg via INTRAMUSCULAR

## 2025-01-12 MED ORDER — TRAMADOL HCL 50 MG PO TABS: 50.0000 mg | ORAL_TABLET | Freq: Three times a day (TID) | ORAL | 0 refills | Status: AC | PRN

## 2025-01-12 MED ORDER — ONDANSETRON 8 MG PO TBDP: 8.0000 mg | ORAL_TABLET | Freq: Three times a day (TID) | ORAL | 1 refills | Status: AC | PRN

## 2025-01-12 NOTE — Progress Notes (Signed)
" ° °  Subjective:    Patient ID: Marilyn Hunter, female    DOB: 11-23-1990, 35 y.o.   MRN: [id number]  Discussed the use of AI scribe software for clinical note transcription with the patient, who gave verbal consent to proceed.  History of Present Illness   [name] is a 35 year old female with migraines who presents with intractable migraine associated with her menstrual cycle.  She reports experiencing severe migraines that coincide with her menstrual cycle, which began on Saturday. These migraines are characterized by nausea, vomiting, and blurred vision. She has a history of migraines and has tried various treatments in the past, including Emgality , which was discontinued due to insurance issues, and Botox , which was ineffective.  She currently uses Nurtec as a rescue medication and reports she has reached the recommended limit for its use. She has attempted lifestyle changes such as reducing coffee intake, avoiding certain foods, and improving sleep to manage her migraines, but these have not been effective. She does not take any daily migraine medications due to lack of success with them.  For her current migraine, she has been using Naprosyn  daily leading up to her period  In the past, she has used Toradol  injections, which have been effective in managing severe migraines.  She has tried Trudhesa and Aimovig  in the past without significant relief. She has also used tramadol  when other medications were minimally effective. She reports experiencing increased stress and recent court-related activities around the time of her current migraine.           Review of Systems     Objective:    Physical Exam Alert and complaining of headache as well as photophobia.      The medical record was extensively reviewed especially a note from neurology from 2022.       Assessment & Plan:     Chronic intractable migraine without aura Chronic migraines exacerbated by menstruation and  stress. Current treatments include Nurtec, Emgality , Botox , and Trudhesa with limited success. Nurtec at maximum use. Naprosyn  and Zofran  used regularly. Toradol  injections effective. - Administered Toradol  injection for acute relief. - Prescribed Zofran  for nausea. - Continue Naprosyn , especially around menstruation. - Prescribed tramadol  for additional pain relief. - Advised to manage symptoms with prescribed medications to avoid ER visits.     This is the first time that she has had a headache this extensive for this long period of time.  Hopefully this will be a rare occurrence however if this continues further evaluation and treatment may be needed.  She was comfortable with that.   "

## 2025-01-15 ENCOUNTER — Encounter (HOSPITAL_BASED_OUTPATIENT_CLINIC_OR_DEPARTMENT_OTHER): Payer: Self-pay | Admitting: Emergency Medicine

## 2025-01-15 ENCOUNTER — Other Ambulatory Visit: Payer: Self-pay

## 2025-01-15 ENCOUNTER — Emergency Department (HOSPITAL_BASED_OUTPATIENT_CLINIC_OR_DEPARTMENT_OTHER)
Admission: EM | Admit: 2025-01-15 | Discharge: 2025-01-15 | Disposition: A | Discharge status: Home / Self Care | Attending: Emergency Medicine | Admitting: Emergency Medicine

## 2025-01-15 ENCOUNTER — Emergency Department (HOSPITAL_BASED_OUTPATIENT_CLINIC_OR_DEPARTMENT_OTHER)

## 2025-01-15 ENCOUNTER — Other Ambulatory Visit (HOSPITAL_BASED_OUTPATIENT_CLINIC_OR_DEPARTMENT_OTHER): Payer: Self-pay

## 2025-01-15 DIAGNOSIS — R1031 Right lower quadrant pain: Secondary | ICD-10-CM | POA: Insufficient documentation

## 2025-01-15 DIAGNOSIS — D72829 Elevated white blood cell count, unspecified: Secondary | ICD-10-CM | POA: Insufficient documentation

## 2025-01-15 DIAGNOSIS — R112 Nausea with vomiting, unspecified: Secondary | ICD-10-CM | POA: Insufficient documentation

## 2025-01-15 DIAGNOSIS — R1032 Left lower quadrant pain: Secondary | ICD-10-CM | POA: Insufficient documentation

## 2025-01-15 DIAGNOSIS — R1030 Lower abdominal pain, unspecified: Secondary | ICD-10-CM

## 2025-01-15 DIAGNOSIS — E860 Dehydration: Secondary | ICD-10-CM | POA: Diagnosis not present

## 2025-01-15 LAB — COMPREHENSIVE METABOLIC PANEL WITH GFR
ALT: 14 U/L (ref 0–44)
AST: 20 U/L (ref 15–41)
Albumin: 4.5 g/dL (ref 3.5–5.0)
Alkaline Phosphatase: 109 U/L (ref 38–126)
Anion gap: 16 — ABNORMAL HIGH (ref 5–15)
BUN: 14 mg/dL (ref 6–20)
CO2: 23 mmol/L (ref 22–32)
Calcium: 10.4 mg/dL — ABNORMAL HIGH (ref 8.9–10.3)
Chloride: 100 mmol/L (ref 98–111)
Creatinine, Ser: 0.73 mg/dL (ref 0.44–1.00)
GFR, Estimated: >60 mL/min
Glucose, Bld: 137 mg/dL — ABNORMAL HIGH (ref 70–99)
Potassium: 4 mmol/L (ref 3.5–5.1)
Sodium: 139 mmol/L (ref 135–145)
Total Bilirubin: 0.4 mg/dL (ref 0.0–1.2)
Total Protein: 7.7 g/dL (ref 6.5–8.1)

## 2025-01-15 LAB — URINALYSIS, W/ REFLEX TO CULTURE (INFECTION SUSPECTED)
Bacteria, UA: NONE SEEN
Bilirubin Urine: NEGATIVE
Glucose, UA: NEGATIVE mg/dL
Hgb urine dipstick: NEGATIVE
Ketones, ur: >80 mg/dL — AB
Leukocytes,Ua: NEGATIVE
Nitrite: NEGATIVE
Protein, ur: 30 mg/dL — AB
Specific Gravity, Urine: 1.028 (ref 1.005–1.030)
pH: 8.5 — ABNORMAL HIGH (ref 5.0–8.0)

## 2025-01-15 LAB — CBC
HCT: 42.1 % (ref 36.0–46.0)
Hemoglobin: 15 g/dL (ref 12.0–15.0)
MCH: 31.8 pg (ref 26.0–34.0)
MCHC: 35.6 g/dL (ref 30.0–36.0)
MCV: 89.4 fL (ref 80.0–100.0)
Platelets: 370 K/uL (ref 150–400)
RBC: 4.71 MIL/uL (ref 3.87–5.11)
RDW: 11.6 % (ref 11.5–15.5)
WBC: 15.2 K/uL — ABNORMAL HIGH (ref 4.0–10.5)
nRBC: 0 % (ref 0.0–0.2)

## 2025-01-15 LAB — PREGNANCY, URINE: Preg Test, Ur: NEGATIVE

## 2025-01-15 LAB — LIPASE, BLOOD: Lipase: 19 U/L (ref 11–51)

## 2025-01-15 MED ORDER — PROCHLORPERAZINE EDISYLATE 10 MG/2ML IJ SOLN
10.0000 mg | Freq: Once | INTRAMUSCULAR | Status: AC
  Administered 2025-01-15: 10 mg via INTRAVENOUS
  Filled 2025-01-15: qty 2

## 2025-01-15 MED ORDER — IOHEXOL 300 MG/ML  SOLN
100.0000 mL | Freq: Once | INTRAMUSCULAR | Status: AC | PRN
  Administered 2025-01-15: 100 mL via INTRAVENOUS

## 2025-01-15 MED ORDER — KETOROLAC TROMETHAMINE 15 MG/ML IJ SOLN
15.0000 mg | Freq: Once | INTRAMUSCULAR | Status: AC
  Administered 2025-01-15: 15 mg via INTRAVENOUS
  Filled 2025-01-15: qty 1

## 2025-01-15 MED ORDER — ONDANSETRON HCL 4 MG/2ML IJ SOLN
4.0000 mg | Freq: Once | INTRAMUSCULAR | Status: AC | PRN
  Administered 2025-01-15: 4 mg via INTRAVENOUS
  Filled 2025-01-15: qty 2

## 2025-01-15 MED ORDER — PROMETHAZINE HCL 25 MG RE SUPP
25.0000 mg | Freq: Four times a day (QID) | RECTAL | 0 refills | Status: AC | PRN
  Filled 2025-01-15: qty 8, 2d supply, fill #0

## 2025-01-15 MED ORDER — DICYCLOMINE HCL 10 MG PO CAPS
10.0000 mg | ORAL_CAPSULE | Freq: Once | ORAL | Status: AC
  Administered 2025-01-15: 10 mg via ORAL
  Filled 2025-01-15: qty 1

## 2025-01-15 MED ORDER — FENTANYL CITRATE (PF) 50 MCG/ML IJ SOSY
50.0000 ug | PREFILLED_SYRINGE | Freq: Once | INTRAMUSCULAR | Status: AC
  Administered 2025-01-15: 50 ug via INTRAVENOUS
  Filled 2025-01-15: qty 1

## 2025-01-15 MED ORDER — DICYCLOMINE HCL 20 MG PO TABS
20.0000 mg | ORAL_TABLET | Freq: Two times a day (BID) | ORAL | 0 refills | Status: AC | PRN
  Filled 2025-01-15: qty 20, 10d supply, fill #0

## 2025-01-15 MED ORDER — LACTATED RINGERS IV BOLUS
1000.0000 mL | Freq: Once | INTRAVENOUS | Status: AC
  Administered 2025-01-15: 1000 mL via INTRAVENOUS

## 2025-01-15 NOTE — Discharge Instructions (Addendum)
 Marilyn Hunter  Thank you for allowing us  to take care of you today.  You came to the Emergency Department today because you had nausea and vomiting as well as lower abdominal pain beginning last night.  Here in the emergency department your labs showed that you are mildly dehydrated, we gave you some fluids, the best way to rehydrate yourself is with fluids by mouth little bit of salt and sugar in them, similar to Gatorade, Pedialyte, etc.  Your CT of the abdomen and pelvis did not show any inflammation or abnormality of your organs, and your ultrasound did not show any twisting or abnormal blood flow or cyst to your ovaries, therefore there is no evidence of a emergency cause to your symptoms.  On my evaluation of your CT it does look like you may be have some extra fluid/stool in your colon, therefore I would be surprised if you develop diarrhea in the next several hours or days.  You are safe to go home and continue to use rest, fluids, Tylenol  and ibuprofen  as needed for pain control.  Additionally we will prescribe Bentyl , which is a antispasm medication that you can use for abdominal pain.  You can use Zofran  by mouth as needed for nausea/vomiting, if you are unable to tolerate the Zofran , we will also prescribe Phenergan  rectal suppositories that you can use.  To-Do: 1. Please follow-up with your primary doctor within 1 - 2 weeks / as soon as possible.   Please return to the Emergency Department or call 911 if you experience have worsening of your symptoms, or do not get better, chest pain, shortness of breath, severe or significantly worsening pain, high fever, severe confusion, pass out or have any reason to think that you need emergency medical care.   We hope you feel better soon.   Mitzie Later, MD Department of Emergency Medicine [facility]

## 2025-01-15 NOTE — ED Notes (Signed)
 Patient is collecting a urine sample at this time.

## 2025-01-15 NOTE — ED Provider Notes (Signed)
 "  EMERGENCY DEPARTMENT AT [facility] Provider Note   CSN: [id number] Arrival date & time: 01/15/25  0745     History Chief Complaint  Patient presents with   Abdominal Pain    HPI: Marilyn Hunter is a 35 y.o. female with history pertinent for migraines, prior ovarian cysts, gastroparesis who presents complaining of abdominal pain. Patient arrived via POV accompanied by husband, Ozell.  History provided by patient and spouse/partner.  No interpreter required during this encounter.  Patient reports that she was in her normal state of health until last night, reports that at approximately 10:30 PM she she began experiencing nausea, vomiting, abdominal pain in her bilateral lower quadrants.  Reports that she has not had associated headache with this, and it is not similar to her typical migraines.  Reports that she is being worked up by gastroenterology for possible gastroparesis, however this pain is also different from that pain.  Denies any diarrhea, chest pain, shortness of breath, fevers, dysuria, possibility of pregnancy.  Endorses that she is currently on her menstrual cycle, endorses chills.  Reports that she has trialed oral Zofran  at home, however reports that this did not alleviate her symptoms.  Patient's recorded medical, surgical, social, medication list and allergies were reviewed in the Snapshot window as part of the initial history.   Prior to Admission medications  Medication Sig Start Date End Date Taking? Authorizing Provider  dicyclomine  (BENTYL ) 20 MG tablet Take 1 tablet (20 mg total) by mouth 2 (two) times daily as needed for spasms (abdominal pain). 01/15/25  Yes Rogelia Jerilynn RAMAN, MD  promethazine  (PHENERGAN ) 25 MG suppository Place 1 suppository (25 mg total) rectally every 6 (six) hours as needed for nausea or vomiting. 01/15/25  Yes Rogelia Jerilynn RAMAN, MD  azithromycin  (ZITHROMAX ) 250 MG tablet 2 tablets day 1, then 1 tablet days 2-4 11/11/24    Tysinger, Alm RAMAN, PA-C  benzonatate  (TESSALON ) 200 MG capsule Take 1 capsule (200 mg total) by mouth 3 (three) times daily as needed for cough. 11/11/24   Tysinger, Alm RAMAN, PA-C  naproxen  sodium (ALEVE ) 220 MG tablet Take 440 mg by mouth.    [provider]  ondansetron  (ZOFRAN  ODT) 8 MG disintegrating tablet Take 1 tablet (8 mg total) by mouth every 8 (eight) hours as needed for nausea or vomiting. 01/12/25   Lalonde, John C, MD  Rimegepant Sulfate (NURTEC) 75 MG TBDP Take 1 tablet (75 mg total) by mouth daily as needed (Maximum 1 tablet in 24 hours). 02/26/23   Joyce Norleen BROCKS, MD  traMADol  (ULTRAM ) 50 MG tablet Take 1 tablet (50 mg total) by mouth every 8 (eight) hours as needed for up to 5 days. 01/12/25 01/17/25  Lalonde, John C, MD     Allergies: Patient has no known allergies.   Review of Systems   ROS as per HPI  Physical Exam Updated Vital Signs BP 106/68 (BP Location: Left Arm)   Pulse 76   Temp 97.8 F (36.6 C) (Oral)   Resp 17   SpO2 94%  Physical Exam Vitals and nursing note reviewed.  Constitutional:      General: She is not in acute distress.    Appearance: Normal appearance. She is well-developed.  HENT:     Head: Normocephalic and atraumatic.  Eyes:     Extraocular Movements: Extraocular movements intact.     Conjunctiva/sclera: Conjunctivae normal.  Cardiovascular:     Rate and Rhythm: Normal rate and regular rhythm.  Pulses: Normal pulses.     Heart sounds: No murmur heard. Pulmonary:     Effort: Pulmonary effort is normal. No respiratory distress.     Breath sounds: Normal breath sounds.  Abdominal:     Palpations: Abdomen is soft.     Tenderness: There is abdominal tenderness in the right lower quadrant, suprapubic area and left lower quadrant. There is no right CVA tenderness or left CVA tenderness.  Musculoskeletal:        General: No swelling.     Cervical back: Neck supple.  Skin:    General: Skin is warm and dry.     Capillary Refill:  Capillary refill takes less than 2 seconds.  Neurological:     General: No focal deficit present.     Mental Status: She is alert and oriented to person, place, and time.  Psychiatric:        Mood and Affect: Mood normal.     ED Course/ Medical Decision Making/ A&P    Procedures Procedures   Medications Ordered in ED Medications  ondansetron  (ZOFRAN ) injection 4 mg (4 mg Intravenous Given 01/15/25 0756)  prochlorperazine  (COMPAZINE ) injection 10 mg (10 mg Intravenous Given 01/15/25 0906)  lactated ringers  bolus 1,000 mL (0 mLs Intravenous Stopped 01/15/25 1027)  fentaNYL  (SUBLIMAZE ) injection 50 mcg (50 mcg Intravenous Given 01/15/25 0910)  iohexol  (OMNIPAQUE ) 300 MG/ML solution 100 mL (100 mLs Intravenous Contrast Given 01/15/25 0855)  dicyclomine  (BENTYL ) capsule 10 mg (10 mg Oral Given 01/15/25 1027)  ketorolac  (TORADOL ) 15 MG/ML injection 15 mg (15 mg Intravenous Given 01/15/25 1022)    Medical Decision Making:   Marilyn Hunter is a 35 y.o. female who presents for nausea, vomiting, bilateral lower abdominal pain as per above.  Physical exam is pertinent for moderate tenderness to palpation in bilateral lower quadrants and suprapubic area.   The differential includes but is not limited to ovarian cyst, ovarian torsion, pregnancy, ectopic pregnancy, bowel obstruction, appendicitis, colitis, diverticulitis, pancreatitis, gastritis, enteritis, gastroenteritis.  Independent historian: Spouse/partner  External data reviewed: Labs: reviewed prior labs for baseline  Initial Plan:  Screening labs including CBC and Metabolic panel to evaluate for infectious or metabolic etiology of disease.  Screening lipase in the setting of abdominal pain Screening hCG given patient's age Urinalysis with reflex culture ordered to evaluate for UTI or relevant urologic/nephrologic pathology.  CT abdomen and pelvis to evaluate for structural/infectious intra-abdominal pathology.  Transvaginal  ultrasound to evaluate for ovarian pathology such as torsion, cyst Objective evaluation as below reviewed   Labs: Ordered, Independent interpretation, and Details: CBC with leukocytosis to 15.2.  No anemia or thrombocytopenia. CMP without AKI, emergent electro gentian, emergent LFT abnormality, mild anion gap elevation consistent with volume losses from repeated emesis, patient with glucose of no history of diabetes, doubt DKA..  Lipase WNL at 10.  hCG negative.  UA without evidence of UTI, mild ketonuria consistent with mild dehydration.  Radiology: Ordered, Independent interpretation, Details: Personally reviewed CT of abdomen pelvis, do not appreciate obstructive bowel gas pattern, free fluid, free air, hyperenhancement, focal fat stranding, I do appreciate slight dilation of large bowel and more fluid consistent density inside the lumen consistent with possible developing diarrheal illness.  Personally viewed transvaginal ultrasound, I appreciate equivalent vascular Doppler of the ovaries bilaterally and no abnormalities that appear to be consistent with a pregnancy either IUP or ectopic late., and All images reviewed independently.  Agree with radiology report at this time.   US  PELVIC COMPLETE W TRANSVAGINAL AND  TORSION R/O Result Date: 01/15/2025 CLINICAL DATA:  Pelvic pain EXAM: TRANSABDOMINAL AND TRANSVAGINAL ULTRASOUND OF PELVIS DOPPLER ULTRASOUND OF OVARIES TECHNIQUE: Both transabdominal and transvaginal ultrasound examinations of the pelvis were performed. Transabdominal technique was performed for global imaging of the pelvis including uterus, ovaries, adnexal regions, and pelvic cul-de-sac. It was necessary to proceed with endovaginal exam following the transabdominal exam to visualize the endometrium and ovaries. Color and duplex Doppler ultrasound was utilized to evaluate blood flow to the ovaries. COMPARISON:  January 04, 2025 FINDINGS: Uterus Measurements: 8.7 x 5.6 x 4.5 cm = volume: 114 mL.  No fibroids or other mass visualized. Endometrium Thickness: 6 mm which is within normal limits. Intrauterine device is noted in grossly good position. Right ovary Measurements: 2.8 x 1.9 x 1.4 cm = volume:  4 mL. Normal appearance/no adnexal mass. Doppler: There is normal vascularity on color doppler examination. Spectral doppler arterial and venous waveforms are normal. Left ovary Measurements: 2.6 x 2.0 x 2.0 cm = volume: 6 mL. Normal appearance/no adnexal mass. Doppler: There is normal vascularity on color doppler examination. Spectral doppler arterial and venous waveforms are normal. Other findings No abnormal free fluid. IMPRESSION: 1. Intrauterine device is noted in grossly good position. 2. No evidence of ovarian mass or torsion. Electronically Signed   By: Lynwood Landy Raddle M.D.   On: 01/15/2025 09:50   CT ABDOMEN PELVIS W CONTRAST Result Date: 01/15/2025 EXAM: CT ABDOMEN AND PELVIS WITH CONTRAST 01/15/2025 09:02:59 AM TECHNIQUE: CT of the abdomen and pelvis was performed with the administration of 100 mL of iohexol  (OMNIPAQUE ) 300 MG/ML solution. Multiplanar reformatted images are provided for review. Automated exposure control, iterative reconstruction, and/or weight-based adjustment of the mA/kV was utilized to reduce the radiation dose to as low as reasonably achievable. COMPARISON: None available. CLINICAL HISTORY: Left Lower Quadrant abdominal pain; Right Lower Quadrant abdominal pain. FINDINGS: LOWER CHEST: No acute abnormality. LIVER: The liver is unremarkable. GALLBLADDER AND BILE DUCTS: Gallbladder is unremarkable. No biliary ductal dilatation. SPLEEN: No acute abnormality. PANCREAS: No acute abnormality. ADRENAL GLANDS: No acute abnormality. KIDNEYS, URETERS AND BLADDER: No stones in the kidneys or ureters. No hydronephrosis. No perinephric or periureteral stranding. The urinary bladder is completely decompressed. GI AND BOWEL: Decompressed stomach. Decompressed, noninflamed appendix containing  inspissated stool and a couple of appendicoliths. There is no bowel obstruction. PERITONEUM AND RETROPERITONEUM: No ascites. No free pelvic fluid. No free air. VASCULATURE: Aorta is normal in caliber. LYMPH NODES: No lymphadenopathy. REPRODUCTIVE ORGANS: Intrauterine device within the upper endometrium. No concern adnexal mass. BONES AND SOFT TISSUES: Prominent L5-S1 degenerative disc disease. No focal soft tissue abnormality. IMPRESSION: 1. No acute findings in the abdomen or pelvis. Electronically signed by: Rogelia Myers MD 01/15/2025 09:44 AM EST RP Workstation: [id number]   NM GASTRIC EMPTYING Result Date: 01/07/2025 CLINICAL DATA:  Right loss, abdominal pain, nausea, early satiety EXAM: NUCLEAR MEDICINE GASTRIC EMPTYING SCAN TECHNIQUE: After oral ingestion of radiolabeled meal, sequential abdominal images were obtained for 120 minutes. Residual percentage of activity remaining within the stomach was calculated at 60 and 120 minutes. RADIOPHARMACEUTICALS:  1.9 mCi Tc-89m sulfur  colloid in standardized meal COMPARISON:  CT January 04, 2025 FINDINGS: Expected location of the stomach in the left upper quadrant. Ingested meal empties the stomach gradually over the course of the study with 25% retention at 60 min and 5% retention at 120 min (normal retention less than 30% at a 120 min). IMPRESSION: Normal gastric emptying study. Electronically Signed   By: Reyes Raymondo HERO.D.  On: 01/07/2025 11:10   CT ABDOMEN PELVIS W CONTRAST Result Date: 01/05/2025 CLINICAL DATA:  Weight loss of 10 pounds in 5 weeks with nausea and diarrhea x6 months. Stabbing right upper quadrant abdominal pain radiates to left lower quadrant EXAM: CT ABDOMEN AND PELVIS WITH CONTRAST TECHNIQUE: Multidetector CT imaging of the abdomen and pelvis was performed using the standard protocol following bolus administration of intravenous contrast. RADIATION DOSE REDUCTION: This exam was performed according to the departmental dose-optimization  program which includes automated exposure control, adjustment of the mA and/or kV according to patient size and/or use of iterative reconstruction technique. CONTRAST:  ISOVUE -300 IOPAMIDOL  (ISOVUE -300) INJECTION 61% COMPARISON:  Ultrasound July 16, 2024 FINDINGS: Lower chest: Small hiatal hernia. Hepatobiliary: No suspicious hepatic lesion. Gallbladder is unremarkable. No biliary ductal dilation. Pancreas: No pancreatic ductal dilation or evidence of acute inflammation. Spleen: No splenomegaly. Adrenals/Urinary Tract: No suspicious adrenal nodule/mass. No hydronephrosis. Kidneys demonstrate symmetric enhancement. Urinary bladder is unremarkable for degree of distension. Stomach/Bowel: Radiopaque enteric contrast material traverses the ascending colon. Stomach is unremarkable for degree of distension. No pathologic dilation of small or large bowel. Noninflamed appendix. Moderate volume of formed stool in the ascending and transverse colon with decompressed descending colon through the rectum. Vascular/Lymphatic: No significant vascular findings are present. No enlarged abdominal or pelvic lymph nodes. Reproductive: Intrauterine device in place. Corpus luteal cyst in the left ovary is considered physiologic and requires no independent imaging follow-up. Other: No significant abdominopelvic free fluid. Musculoskeletal: L5-S1 discogenic disease with vacuum disc artifact. IMPRESSION: 1. No acute abnormality in the abdomen or pelvis. 2. Moderate volume of formed stool in the ascending and transverse colon with decompressed descending colon through the rectum. 3. Small hiatal hernia. 4. L5-S1 discogenic disease with vacuum disc artifact. Electronically Signed   By: Reyes Holder M.D.   On: 01/05/2025 08:24    EKG/Medicine tests: Not indicated EKG Interpretation:                  Interventions: Zofran , fentanyl , LR bolus, Compazine   See the EMR for full details regarding lab and imaging results.  Patient  presents to the emergency department for nausea, vomiting, abdominal pain.  Patient has ongoing dry heaving on my exam after already having received IV Zofran  in triage, thus we will instead trial IV Compazine  for nausea/vomiting.  Patient does have moderate tenderness to palpation, therefore does warrant labs, imaging, symptomatic control as per above.  Patient fortunately is vitally stable on exam.  Urine pregnancy test is negative, therefore doubt IUP, ectopic pregnancy, etc.  UA without evidence of UTI, blood, no CVA tenderness on exam, therefore low index of suspicion for cystitis, pyelonephritis, nephrolithiasis.  Labs demonstrate mild dehydration, mild leukocytosis, likely stress to margination in the setting of repeated vomiting/dehydration.  Transvaginal ultrasound as well as CT are reassuring against emergent intra-abdominal process.  Patient significantly improved and has cessation of vomiting on reevaluation, thus do feel that patient is stable for discharge and outpatient follow-up with PCP, will prescribe course of Bentyl  as well as rectal Phenergan  should she not be able to tolerate Zofran  at home.  Presentation is most consistent with acute complicated illness and I did consider and rule out acute life/limb-threatening illness  Discussion of management or test interpretations with external provider(s): Not indicated  Risk Drugs:OTC drugs, Prescription drug management, and Parenteral controlled substances  Disposition: DISCHARGE: I believe that the patient is safe for discharge home with outpatient follow-up. Patient was informed of all pertinent physical exam,  laboratory, and imaging findings. Patient's suspected etiology of their symptom presentation was discussed with the patient and all questions were answered. We discussed following up with PCP. I provided thorough ED return precautions. The patient feels safe and comfortable with this plan.  MDM generated using voice dictation  software and may contain dictation errors.  Please contact me for any clarification or with any questions.  Clinical Impression:  1. Lower abdominal pain   2. Nausea and vomiting, unspecified vomiting type      Discharge   Final Clinical Impression(s) / ED Diagnoses Final diagnoses:  Lower abdominal pain  Nausea and vomiting, unspecified vomiting type    Rx / DC Orders ED Discharge Orders          Ordered    dicyclomine  (BENTYL ) 20 MG tablet  2 times daily PRN        01/15/25 1021    promethazine  (PHENERGAN ) 25 MG suppository  Every 6 hours PRN        01/15/25 1021             Rogelia Jerilynn RAMAN, MD 01/15/25 1207  "

## 2025-01-15 NOTE — ED Triage Notes (Signed)
 Reports n/v since 2230 last night. Lower abd pain. Denies fevers.

## 2025-01-18 ENCOUNTER — Ambulatory Visit (HOSPITAL_COMMUNITY)
